# Patient Record
Sex: Female | Born: 1935
Health system: Southern US, Community
[De-identification: ages and names within clinical notes are randomized; demographics above are authoritative.]

## PROBLEM LIST (undated history)

## (undated) DIAGNOSIS — I10 Essential (primary) hypertension: Secondary | ICD-10-CM

## (undated) DIAGNOSIS — E785 Hyperlipidemia, unspecified: Secondary | ICD-10-CM

---

## 2001-04-17 ENCOUNTER — Encounter: Payer: Self-pay | Admitting: Orthopaedic Surgery

## 2001-04-17 ENCOUNTER — Encounter: Payer: Self-pay | Admitting: Emergency Medicine

## 2001-04-17 ENCOUNTER — Inpatient Hospital Stay (HOSPITAL_COMMUNITY): Admission: EM | Admit: 2001-04-17 | Discharge: 2001-04-21 | Payer: Self-pay | Admitting: Emergency Medicine

## 2001-04-18 ENCOUNTER — Encounter: Payer: Self-pay | Admitting: Orthopaedic Surgery

## 2001-06-07 ENCOUNTER — Encounter: Admission: RE | Admit: 2001-06-07 | Discharge: 2001-07-07 | Payer: Self-pay | Admitting: Orthopaedic Surgery

## 2002-04-29 ENCOUNTER — Ambulatory Visit (HOSPITAL_COMMUNITY): Admission: RE | Admit: 2002-04-29 | Discharge: 2002-04-29 | Payer: Self-pay | Admitting: Family Medicine

## 2005-06-11 ENCOUNTER — Other Ambulatory Visit: Admission: RE | Admit: 2005-06-11 | Discharge: 2005-06-11 | Payer: Self-pay | Admitting: Family Medicine

## 2013-06-16 ENCOUNTER — Other Ambulatory Visit (HOSPITAL_COMMUNITY): Payer: Self-pay

## 2013-06-17 ENCOUNTER — Encounter (HOSPITAL_COMMUNITY): Payer: Self-pay

## 2013-06-30 ENCOUNTER — Encounter (HOSPITAL_COMMUNITY)
Admission: RE | Admit: 2013-06-30 | Discharge: 2013-06-30 | Disposition: A | Discharge status: Home / Self Care | Payer: Medicare Other | Source: Ambulatory Visit | Attending: Nephrology | Admitting: Nephrology

## 2013-06-30 DIAGNOSIS — N183 Chronic kidney disease, stage 3 unspecified: Secondary | ICD-10-CM | POA: Insufficient documentation

## 2013-06-30 DIAGNOSIS — D631 Anemia in chronic kidney disease: Secondary | ICD-10-CM | POA: Insufficient documentation

## 2013-06-30 DIAGNOSIS — I129 Hypertensive chronic kidney disease with stage 1 through stage 4 chronic kidney disease, or unspecified chronic kidney disease: Secondary | ICD-10-CM | POA: Insufficient documentation

## 2013-06-30 LAB — RENAL FUNCTION PANEL
Albumin: 4.2 g/dL (ref 3.5–5.2)
BUN: 25 mg/dL — ABNORMAL HIGH (ref 6–23)
CO2: 19 mEq/L (ref 19–32)
Calcium: 9.4 mg/dL (ref 8.4–10.5)
Chloride: 108 mEq/L (ref 96–112)
Creatinine, Ser: 2.02 mg/dL — ABNORMAL HIGH (ref 0.50–1.10)
GFR calc Af Amer: 26 mL/min — ABNORMAL LOW (ref >90)
GFR calc non Af Amer: 23 mL/min — ABNORMAL LOW (ref >90)
Glucose, Bld: 97 mg/dL (ref 70–99)
Phosphorus: 3.8 mg/dL (ref 2.3–4.6)
Potassium: 4.8 mEq/L (ref 3.5–5.1)
Sodium: 140 mEq/L (ref 135–145)

## 2013-06-30 LAB — POCT HEMOGLOBIN-HEMACUE: Hemoglobin: 8.6 g/dL — ABNORMAL LOW (ref 12.0–15.0)

## 2013-06-30 MED ORDER — EPOETIN ALFA 10000 UNIT/ML IJ SOLN: 10000.0000 [IU] | INTRAMUSCULAR | Status: DC

## 2013-06-30 MED ORDER — EPOETIN ALFA 20000 UNIT/ML IJ SOLN
INTRAMUSCULAR | Status: AC
  Administered 2013-06-30: 20000 [IU]
  Filled 2013-06-30: qty 1

## 2013-07-13 ENCOUNTER — Other Ambulatory Visit (HOSPITAL_COMMUNITY): Payer: Self-pay | Admitting: *Deleted

## 2013-07-14 ENCOUNTER — Encounter (HOSPITAL_COMMUNITY)
Admission: RE | Admit: 2013-07-14 | Discharge: 2013-07-14 | Disposition: A | Discharge status: Home / Self Care | Payer: Medicare Other | Source: Ambulatory Visit | Attending: Nephrology | Admitting: Nephrology

## 2013-07-14 LAB — POCT HEMOGLOBIN-HEMACUE: Hemoglobin: 8.5 g/dL — ABNORMAL LOW (ref 12.0–15.0)

## 2013-07-14 MED ORDER — EPOETIN ALFA 20000 UNIT/ML IJ SOLN
INTRAMUSCULAR | Status: AC
  Filled 2013-07-14: qty 1

## 2013-07-14 MED ORDER — EPOETIN ALFA 20000 UNIT/ML IJ SOLN
20000.0000 [IU] | INTRAMUSCULAR | Status: DC
  Administered 2013-07-14: 20000 [IU] via SUBCUTANEOUS

## 2013-07-28 ENCOUNTER — Encounter (HOSPITAL_COMMUNITY)
Admission: RE | Admit: 2013-07-28 | Discharge: 2013-07-28 | Disposition: A | Discharge status: Home / Self Care | Payer: Medicare Other | Source: Ambulatory Visit | Attending: Nephrology | Admitting: Nephrology

## 2013-07-28 DIAGNOSIS — I129 Hypertensive chronic kidney disease with stage 1 through stage 4 chronic kidney disease, or unspecified chronic kidney disease: Secondary | ICD-10-CM | POA: Insufficient documentation

## 2013-07-28 DIAGNOSIS — D631 Anemia in chronic kidney disease: Secondary | ICD-10-CM | POA: Insufficient documentation

## 2013-07-28 DIAGNOSIS — N183 Chronic kidney disease, stage 3 unspecified: Secondary | ICD-10-CM | POA: Insufficient documentation

## 2013-07-28 LAB — IRON AND TIBC
Iron: 126 ug/dL (ref 42–135)
Saturation Ratios: 32 % (ref 20–55)
TIBC: 397 ug/dL (ref 250–470)
UIBC: 271 ug/dL (ref 125–400)

## 2013-07-28 LAB — POCT HEMOGLOBIN-HEMACUE: Hemoglobin: 9.7 g/dL — ABNORMAL LOW (ref 12.0–15.0)

## 2013-07-28 LAB — FERRITIN: Ferritin: 190 ng/mL (ref 10–291)

## 2013-07-28 MED ORDER — EPOETIN ALFA 20000 UNIT/ML IJ SOLN: 20000.0000 [IU] | INTRAMUSCULAR | Status: DC

## 2013-07-28 MED ORDER — EPOETIN ALFA 20000 UNIT/ML IJ SOLN
INTRAMUSCULAR | Status: AC
  Administered 2013-07-28: 20000 [IU]
  Filled 2013-07-28: qty 1

## 2013-08-12 ENCOUNTER — Encounter (HOSPITAL_COMMUNITY)
Admission: RE | Admit: 2013-08-12 | Discharge: 2013-08-12 | Disposition: A | Discharge status: Home / Self Care | Payer: Medicare Other | Source: Ambulatory Visit | Attending: Nephrology | Admitting: Nephrology

## 2013-08-12 LAB — POCT HEMOGLOBIN-HEMACUE: Hemoglobin: 9.8 g/dL — ABNORMAL LOW (ref 12.0–15.0)

## 2013-08-12 MED ORDER — EPOETIN ALFA 20000 UNIT/ML IJ SOLN
INTRAMUSCULAR | Status: AC
  Filled 2013-08-12: qty 1

## 2013-08-12 MED ORDER — EPOETIN ALFA 20000 UNIT/ML IJ SOLN
20000.0000 [IU] | INTRAMUSCULAR | Status: DC
  Administered 2013-08-12: 20000 [IU] via SUBCUTANEOUS

## 2013-08-26 ENCOUNTER — Encounter (HOSPITAL_COMMUNITY)
Admission: RE | Admit: 2013-08-26 | Discharge: 2013-08-26 | Disposition: A | Discharge status: Home / Self Care | Payer: Medicare Other | Source: Ambulatory Visit | Attending: Nephrology | Admitting: Nephrology

## 2013-08-26 DIAGNOSIS — N183 Chronic kidney disease, stage 3 unspecified: Secondary | ICD-10-CM | POA: Insufficient documentation

## 2013-08-26 DIAGNOSIS — I129 Hypertensive chronic kidney disease with stage 1 through stage 4 chronic kidney disease, or unspecified chronic kidney disease: Secondary | ICD-10-CM | POA: Insufficient documentation

## 2013-08-26 DIAGNOSIS — D631 Anemia in chronic kidney disease: Secondary | ICD-10-CM | POA: Insufficient documentation

## 2013-08-26 LAB — RENAL FUNCTION PANEL
Albumin: 4.3 g/dL (ref 3.5–5.2)
BUN: 25 mg/dL — ABNORMAL HIGH (ref 6–23)
CO2: 20 mEq/L (ref 19–32)
Calcium: 9 mg/dL (ref 8.4–10.5)
Chloride: 105 mEq/L (ref 96–112)
Creatinine, Ser: 2.19 mg/dL — ABNORMAL HIGH (ref 0.50–1.10)
GFR calc Af Amer: 24 mL/min — ABNORMAL LOW (ref >90)
GFR calc non Af Amer: 20 mL/min — ABNORMAL LOW (ref >90)
Glucose, Bld: 88 mg/dL (ref 70–99)
Phosphorus: 4.4 mg/dL (ref 2.3–4.6)
Potassium: 5.6 mEq/L — ABNORMAL HIGH (ref 3.5–5.1)
Sodium: 136 mEq/L (ref 135–145)

## 2013-08-26 LAB — POCT HEMOGLOBIN-HEMACUE: Hemoglobin: 10.1 g/dL — ABNORMAL LOW (ref 12.0–15.0)

## 2013-08-26 MED ORDER — EPOETIN ALFA 20000 UNIT/ML IJ SOLN
20000.0000 [IU] | INTRAMUSCULAR | Status: DC
  Administered 2013-08-26: 20000 [IU] via SUBCUTANEOUS

## 2013-08-26 MED ORDER — EPOETIN ALFA 20000 UNIT/ML IJ SOLN
INTRAMUSCULAR | Status: AC
  Filled 2013-08-26: qty 1

## 2013-08-29 LAB — PTH, INTACT AND CALCIUM
Calcium, Total (PTH): 8.8 mg/dL (ref 8.4–10.5)
PTH: 66 pg/mL (ref 14.0–72.0)

## 2013-09-09 ENCOUNTER — Encounter (HOSPITAL_COMMUNITY)
Admission: RE | Admit: 2013-09-09 | Discharge: 2013-09-09 | Disposition: A | Discharge status: Home / Self Care | Payer: Medicare Other | Source: Ambulatory Visit | Attending: Nephrology | Admitting: Nephrology

## 2013-09-09 LAB — IRON AND TIBC
Iron: 156 ug/dL — ABNORMAL HIGH (ref 42–135)
Saturation Ratios: 42 % (ref 20–55)
TIBC: 370 ug/dL (ref 250–470)
UIBC: 214 ug/dL (ref 125–400)

## 2013-09-09 LAB — FERRITIN: Ferritin: 222 ng/mL (ref 10–291)

## 2013-09-09 LAB — POCT HEMOGLOBIN-HEMACUE: Hemoglobin: 10.2 g/dL — ABNORMAL LOW (ref 12.0–15.0)

## 2013-09-09 MED ORDER — EPOETIN ALFA 20000 UNIT/ML IJ SOLN
INTRAMUSCULAR | Status: AC
  Administered 2013-09-09: 20000 [IU] via SUBCUTANEOUS
  Filled 2013-09-09: qty 1

## 2013-09-09 MED ORDER — EPOETIN ALFA 20000 UNIT/ML IJ SOLN
20000.0000 [IU] | INTRAMUSCULAR | Status: DC
  Administered 2013-09-09: 20000 [IU] via SUBCUTANEOUS

## 2013-09-23 ENCOUNTER — Encounter (HOSPITAL_COMMUNITY)
Admission: RE | Admit: 2013-09-23 | Discharge: 2013-09-23 | Disposition: A | Discharge status: Home / Self Care | Payer: Medicare Other | Source: Ambulatory Visit | Attending: Nephrology | Admitting: Nephrology

## 2013-09-23 DIAGNOSIS — I129 Hypertensive chronic kidney disease with stage 1 through stage 4 chronic kidney disease, or unspecified chronic kidney disease: Secondary | ICD-10-CM | POA: Insufficient documentation

## 2013-09-23 DIAGNOSIS — N183 Chronic kidney disease, stage 3 unspecified: Secondary | ICD-10-CM | POA: Insufficient documentation

## 2013-09-23 DIAGNOSIS — D631 Anemia in chronic kidney disease: Secondary | ICD-10-CM | POA: Insufficient documentation

## 2013-09-23 LAB — POCT HEMOGLOBIN-HEMACUE: Hemoglobin: 9.8 g/dL — ABNORMAL LOW (ref 12.0–15.0)

## 2013-09-23 MED ORDER — EPOETIN ALFA 20000 UNIT/ML IJ SOLN: 20000.0000 [IU] | INTRAMUSCULAR | Status: DC

## 2013-09-23 MED ORDER — EPOETIN ALFA 20000 UNIT/ML IJ SOLN
INTRAMUSCULAR | Status: AC
  Administered 2013-09-23: 20000 [IU] via SUBCUTANEOUS
  Filled 2013-09-23: qty 1

## 2013-10-06 ENCOUNTER — Other Ambulatory Visit (HOSPITAL_COMMUNITY): Payer: Self-pay | Admitting: *Deleted

## 2013-10-07 ENCOUNTER — Encounter (HOSPITAL_COMMUNITY)
Admission: RE | Admit: 2013-10-07 | Discharge: 2013-10-07 | Disposition: A | Discharge status: Home / Self Care | Payer: Medicare Other | Source: Ambulatory Visit | Attending: Nephrology | Admitting: Nephrology

## 2013-10-07 LAB — IRON AND TIBC
Iron: 141 ug/dL — ABNORMAL HIGH (ref 42–135)
Saturation Ratios: 36 % (ref 20–55)
TIBC: 397 ug/dL (ref 250–470)
UIBC: 256 ug/dL (ref 125–400)

## 2013-10-07 LAB — POCT HEMOGLOBIN-HEMACUE: Hemoglobin: 10.3 g/dL — ABNORMAL LOW (ref 12.0–15.0)

## 2013-10-07 LAB — FERRITIN: Ferritin: 276 ng/mL (ref 10–291)

## 2013-10-07 MED ORDER — EPOETIN ALFA 20000 UNIT/ML IJ SOLN
20000.0000 [IU] | INTRAMUSCULAR | Status: DC
  Administered 2013-10-07: 20000 [IU] via SUBCUTANEOUS

## 2013-10-07 MED ORDER — EPOETIN ALFA 20000 UNIT/ML IJ SOLN
INTRAMUSCULAR | Status: AC
  Filled 2013-10-07: qty 1

## 2013-11-03 ENCOUNTER — Other Ambulatory Visit (HOSPITAL_COMMUNITY): Payer: Self-pay | Admitting: *Deleted

## 2013-11-04 ENCOUNTER — Encounter (HOSPITAL_COMMUNITY)
Admission: RE | Admit: 2013-11-04 | Discharge: 2013-11-04 | Disposition: A | Discharge status: Home / Self Care | Payer: Medicare Other | Source: Ambulatory Visit | Attending: Nephrology | Admitting: Nephrology

## 2013-11-04 DIAGNOSIS — D631 Anemia in chronic kidney disease: Secondary | ICD-10-CM | POA: Insufficient documentation

## 2013-11-04 DIAGNOSIS — N183 Chronic kidney disease, stage 3 unspecified: Secondary | ICD-10-CM | POA: Insufficient documentation

## 2013-11-04 DIAGNOSIS — N039 Chronic nephritic syndrome with unspecified morphologic changes: Principal | ICD-10-CM

## 2013-11-04 DIAGNOSIS — I129 Hypertensive chronic kidney disease with stage 1 through stage 4 chronic kidney disease, or unspecified chronic kidney disease: Secondary | ICD-10-CM | POA: Insufficient documentation

## 2013-11-04 LAB — IRON AND TIBC
Iron: 158 ug/dL — ABNORMAL HIGH (ref 42–135)
Saturation Ratios: 46 % (ref 20–55)
TIBC: 347 ug/dL (ref 250–470)
UIBC: 189 ug/dL (ref 125–400)

## 2013-11-04 LAB — FERRITIN: Ferritin: 745 ng/mL — ABNORMAL HIGH (ref 10–291)

## 2013-11-04 LAB — POCT HEMOGLOBIN-HEMACUE: Hemoglobin: 9.3 g/dL — ABNORMAL LOW (ref 12.0–15.0)

## 2013-11-04 MED ORDER — EPOETIN ALFA 20000 UNIT/ML IJ SOLN
INTRAMUSCULAR | Status: AC
  Administered 2013-11-04: 20000 [IU] via SUBCUTANEOUS
  Filled 2013-11-04: qty 1

## 2013-11-04 MED ORDER — EPOETIN ALFA 20000 UNIT/ML IJ SOLN: 20000.0000 [IU] | INTRAMUSCULAR | Status: DC

## 2013-12-01 ENCOUNTER — Other Ambulatory Visit (HOSPITAL_COMMUNITY): Payer: Self-pay | Admitting: *Deleted

## 2013-12-02 ENCOUNTER — Encounter (HOSPITAL_COMMUNITY)
Admission: RE | Admit: 2013-12-02 | Discharge: 2013-12-02 | Disposition: A | Discharge status: Home / Self Care | Payer: Medicare Other | Source: Ambulatory Visit | Attending: Nephrology | Admitting: Nephrology

## 2013-12-02 DIAGNOSIS — I129 Hypertensive chronic kidney disease with stage 1 through stage 4 chronic kidney disease, or unspecified chronic kidney disease: Secondary | ICD-10-CM | POA: Insufficient documentation

## 2013-12-02 DIAGNOSIS — N183 Chronic kidney disease, stage 3 unspecified: Secondary | ICD-10-CM | POA: Insufficient documentation

## 2013-12-02 DIAGNOSIS — D631 Anemia in chronic kidney disease: Secondary | ICD-10-CM | POA: Insufficient documentation

## 2013-12-02 DIAGNOSIS — N039 Chronic nephritic syndrome with unspecified morphologic changes: Principal | ICD-10-CM

## 2013-12-02 LAB — IRON AND TIBC
Iron: 178 ug/dL — ABNORMAL HIGH (ref 42–135)
Saturation Ratios: 49 % (ref 20–55)
TIBC: 366 ug/dL (ref 250–470)
UIBC: 188 ug/dL (ref 125–400)

## 2013-12-02 LAB — FERRITIN: Ferritin: 761 ng/mL — ABNORMAL HIGH (ref 10–291)

## 2013-12-02 MED ORDER — EPOETIN ALFA 40000 UNIT/ML IJ SOLN
40000.0000 [IU] | INTRAMUSCULAR | Status: DC
  Administered 2013-12-02: 40000 [IU] via SUBCUTANEOUS

## 2013-12-02 MED ORDER — EPOETIN ALFA 40000 UNIT/ML IJ SOLN
INTRAMUSCULAR | Status: AC
  Filled 2013-12-02: qty 1

## 2013-12-05 LAB — POCT HEMOGLOBIN-HEMACUE: Hemoglobin: 9.7 g/dL — ABNORMAL LOW (ref 12.0–15.0)

## 2013-12-30 ENCOUNTER — Encounter (HOSPITAL_COMMUNITY)
Admission: RE | Admit: 2013-12-30 | Discharge: 2013-12-30 | Disposition: A | Discharge status: Home / Self Care | Payer: Medicare Other | Source: Ambulatory Visit | Attending: Nephrology | Admitting: Nephrology

## 2013-12-30 DIAGNOSIS — D631 Anemia in chronic kidney disease: Secondary | ICD-10-CM | POA: Insufficient documentation

## 2013-12-30 DIAGNOSIS — N183 Chronic kidney disease, stage 3 unspecified: Secondary | ICD-10-CM | POA: Insufficient documentation

## 2013-12-30 DIAGNOSIS — I129 Hypertensive chronic kidney disease with stage 1 through stage 4 chronic kidney disease, or unspecified chronic kidney disease: Secondary | ICD-10-CM | POA: Insufficient documentation

## 2013-12-30 DIAGNOSIS — N039 Chronic nephritic syndrome with unspecified morphologic changes: Principal | ICD-10-CM

## 2013-12-30 LAB — POCT HEMOGLOBIN-HEMACUE: Hemoglobin: 9.8 g/dL — ABNORMAL LOW (ref 12.0–15.0)

## 2013-12-30 LAB — IRON AND TIBC
Iron: 187 ug/dL — ABNORMAL HIGH (ref 42–135)
Saturation Ratios: 51 % (ref 20–55)
TIBC: 364 ug/dL (ref 250–470)
UIBC: 177 ug/dL (ref 125–400)

## 2013-12-30 LAB — FERRITIN: Ferritin: 653 ng/mL — ABNORMAL HIGH (ref 10–291)

## 2013-12-30 MED ORDER — EPOETIN ALFA 40000 UNIT/ML IJ SOLN
40000.0000 [IU] | INTRAMUSCULAR | Status: DC
  Administered 2013-12-30: 40000 [IU] via SUBCUTANEOUS

## 2013-12-30 MED ORDER — EPOETIN ALFA 40000 UNIT/ML IJ SOLN
INTRAMUSCULAR | Status: AC
  Filled 2013-12-30: qty 1

## 2014-01-27 ENCOUNTER — Encounter (HOSPITAL_COMMUNITY)
Admission: RE | Admit: 2014-01-27 | Discharge: 2014-01-27 | Disposition: A | Discharge status: Home / Self Care | Payer: Medicare Other | Source: Ambulatory Visit | Attending: Nephrology | Admitting: Nephrology

## 2014-01-27 DIAGNOSIS — D631 Anemia in chronic kidney disease: Secondary | ICD-10-CM | POA: Insufficient documentation

## 2014-01-27 DIAGNOSIS — N039 Chronic nephritic syndrome with unspecified morphologic changes: Principal | ICD-10-CM

## 2014-01-27 DIAGNOSIS — N183 Chronic kidney disease, stage 3 unspecified: Secondary | ICD-10-CM | POA: Insufficient documentation

## 2014-01-27 DIAGNOSIS — I129 Hypertensive chronic kidney disease with stage 1 through stage 4 chronic kidney disease, or unspecified chronic kidney disease: Secondary | ICD-10-CM | POA: Insufficient documentation

## 2014-01-27 LAB — IRON AND TIBC
Iron: 157 ug/dL — ABNORMAL HIGH (ref 42–135)
Saturation Ratios: 49 % (ref 20–55)
TIBC: 320 ug/dL (ref 250–470)
UIBC: 163 ug/dL (ref 125–400)

## 2014-01-27 LAB — FERRITIN: Ferritin: 678 ng/mL — ABNORMAL HIGH (ref 10–291)

## 2014-01-27 LAB — POCT HEMOGLOBIN-HEMACUE: Hemoglobin: 9.1 g/dL — ABNORMAL LOW (ref 12.0–15.0)

## 2014-01-27 MED ORDER — EPOETIN ALFA 40000 UNIT/ML IJ SOLN
INTRAMUSCULAR | Status: AC
  Filled 2014-01-27: qty 1

## 2014-01-27 MED ORDER — EPOETIN ALFA 40000 UNIT/ML IJ SOLN
40000.0000 [IU] | INTRAMUSCULAR | Status: DC
  Administered 2014-01-27: 40000 [IU] via SUBCUTANEOUS

## 2014-02-13 ENCOUNTER — Telehealth: Payer: Self-pay | Admitting: Oncology

## 2014-02-13 NOTE — Telephone Encounter (Signed)
LEFT MESSAGE FOR PATIENT TO RETURN CALL TO SCHEDULE NP APPT.  °

## 2014-02-24 ENCOUNTER — Encounter (HOSPITAL_COMMUNITY)
Admission: RE | Admit: 2014-02-24 | Discharge: 2014-02-24 | Disposition: A | Discharge status: Home / Self Care | Payer: Medicare Other | Source: Ambulatory Visit | Attending: Nephrology | Admitting: Nephrology

## 2014-02-24 DIAGNOSIS — N183 Chronic kidney disease, stage 3 unspecified: Secondary | ICD-10-CM | POA: Insufficient documentation

## 2014-02-24 DIAGNOSIS — N039 Chronic nephritic syndrome with unspecified morphologic changes: Principal | ICD-10-CM

## 2014-02-24 DIAGNOSIS — I129 Hypertensive chronic kidney disease with stage 1 through stage 4 chronic kidney disease, or unspecified chronic kidney disease: Secondary | ICD-10-CM | POA: Insufficient documentation

## 2014-02-24 DIAGNOSIS — D631 Anemia in chronic kidney disease: Secondary | ICD-10-CM | POA: Insufficient documentation

## 2014-02-24 LAB — IRON AND TIBC
Iron: 157 ug/dL — ABNORMAL HIGH (ref 42–135)
Saturation Ratios: 48 % (ref 20–55)
TIBC: 325 ug/dL (ref 250–470)
UIBC: 168 ug/dL (ref 125–400)

## 2014-02-24 LAB — FERRITIN: Ferritin: 545 ng/mL — ABNORMAL HIGH (ref 10–291)

## 2014-02-24 LAB — POCT HEMOGLOBIN-HEMACUE: Hemoglobin: 9.6 g/dL — ABNORMAL LOW (ref 12.0–15.0)

## 2014-02-24 MED ORDER — EPOETIN ALFA 40000 UNIT/ML IJ SOLN
INTRAMUSCULAR | Status: AC
Start: 2014-02-24 — End: 2014-02-24
  Filled 2014-02-24: qty 1

## 2014-02-24 MED ORDER — EPOETIN ALFA 40000 UNIT/ML IJ SOLN
40000.0000 [IU] | INTRAMUSCULAR | Status: DC
  Administered 2014-02-24: 40000 [IU] via SUBCUTANEOUS

## 2014-03-24 ENCOUNTER — Encounter (HOSPITAL_COMMUNITY)
Admission: RE | Admit: 2014-03-24 | Discharge: 2014-03-24 | Disposition: A | Discharge status: Home / Self Care | Payer: Medicare Other | Source: Ambulatory Visit | Attending: Nephrology | Admitting: Nephrology

## 2014-03-24 DIAGNOSIS — N183 Chronic kidney disease, stage 3 unspecified: Secondary | ICD-10-CM | POA: Insufficient documentation

## 2014-03-24 DIAGNOSIS — I129 Hypertensive chronic kidney disease with stage 1 through stage 4 chronic kidney disease, or unspecified chronic kidney disease: Secondary | ICD-10-CM | POA: Insufficient documentation

## 2014-03-24 DIAGNOSIS — D631 Anemia in chronic kidney disease: Secondary | ICD-10-CM | POA: Insufficient documentation

## 2014-03-24 DIAGNOSIS — N039 Chronic nephritic syndrome with unspecified morphologic changes: Principal | ICD-10-CM

## 2014-03-24 LAB — POCT HEMOGLOBIN-HEMACUE: Hemoglobin: 10.1 g/dL — ABNORMAL LOW (ref 12.0–15.0)

## 2014-03-24 LAB — IRON AND TIBC
Iron: 158 ug/dL — ABNORMAL HIGH (ref 42–135)
Saturation Ratios: 46 % (ref 20–55)
TIBC: 347 ug/dL (ref 250–470)
UIBC: 189 ug/dL (ref 125–400)

## 2014-03-24 LAB — FERRITIN: Ferritin: 815 ng/mL — ABNORMAL HIGH (ref 10–291)

## 2014-03-24 MED ORDER — EPOETIN ALFA 40000 UNIT/ML IJ SOLN: 40000.0000 [IU] | INTRAMUSCULAR | Status: DC

## 2014-03-24 MED ORDER — EPOETIN ALFA 40000 UNIT/ML IJ SOLN
INTRAMUSCULAR | Status: AC
  Administered 2014-03-24: 40000 [IU] via SUBCUTANEOUS
  Filled 2014-03-24: qty 1

## 2014-04-24 ENCOUNTER — Encounter (HOSPITAL_COMMUNITY): Payer: Medicare Other

## 2014-04-28 ENCOUNTER — Encounter (HOSPITAL_COMMUNITY)
Admission: RE | Admit: 2014-04-28 | Discharge: 2014-04-28 | Disposition: A | Discharge status: Home / Self Care | Payer: Medicare Other | Source: Ambulatory Visit | Attending: Nephrology | Admitting: Nephrology

## 2014-04-28 DIAGNOSIS — N039 Chronic nephritic syndrome with unspecified morphologic changes: Principal | ICD-10-CM

## 2014-04-28 DIAGNOSIS — N183 Chronic kidney disease, stage 3 unspecified: Secondary | ICD-10-CM | POA: Diagnosis not present

## 2014-04-28 DIAGNOSIS — D631 Anemia in chronic kidney disease: Secondary | ICD-10-CM | POA: Diagnosis not present

## 2014-04-28 DIAGNOSIS — I129 Hypertensive chronic kidney disease with stage 1 through stage 4 chronic kidney disease, or unspecified chronic kidney disease: Secondary | ICD-10-CM | POA: Diagnosis not present

## 2014-04-28 LAB — FERRITIN: Ferritin: 775 ng/mL — ABNORMAL HIGH (ref 10–291)

## 2014-04-28 LAB — IRON AND TIBC
Iron: 173 ug/dL — ABNORMAL HIGH (ref 42–135)
Saturation Ratios: 50 % (ref 20–55)
TIBC: 345 ug/dL (ref 250–470)
UIBC: 172 ug/dL (ref 125–400)

## 2014-04-28 LAB — POCT HEMOGLOBIN-HEMACUE: Hemoglobin: 9.6 g/dL — ABNORMAL LOW (ref 12.0–15.0)

## 2014-04-28 MED ORDER — EPOETIN ALFA 40000 UNIT/ML IJ SOLN
40000.0000 [IU] | INTRAMUSCULAR | Status: DC
  Administered 2014-04-28: 40000 [IU] via SUBCUTANEOUS

## 2014-04-28 MED ORDER — EPOETIN ALFA 40000 UNIT/ML IJ SOLN
INTRAMUSCULAR | Status: AC
  Administered 2014-04-28: 40000 [IU] via SUBCUTANEOUS
  Filled 2014-04-28: qty 1

## 2014-05-26 ENCOUNTER — Encounter (HOSPITAL_COMMUNITY)
Admission: RE | Admit: 2014-05-26 | Discharge: 2014-05-26 | Disposition: A | Discharge status: Home / Self Care | Payer: Medicare Other | Source: Ambulatory Visit | Attending: Nephrology | Admitting: Nephrology

## 2014-05-26 DIAGNOSIS — N183 Chronic kidney disease, stage 3 unspecified: Secondary | ICD-10-CM | POA: Insufficient documentation

## 2014-05-26 DIAGNOSIS — I129 Hypertensive chronic kidney disease with stage 1 through stage 4 chronic kidney disease, or unspecified chronic kidney disease: Secondary | ICD-10-CM | POA: Diagnosis not present

## 2014-05-26 DIAGNOSIS — D631 Anemia in chronic kidney disease: Secondary | ICD-10-CM | POA: Diagnosis not present

## 2014-05-26 DIAGNOSIS — N039 Chronic nephritic syndrome with unspecified morphologic changes: Secondary | ICD-10-CM | POA: Diagnosis not present

## 2014-05-26 LAB — FERRITIN: Ferritin: 651 ng/mL — ABNORMAL HIGH (ref 10–291)

## 2014-05-26 LAB — IRON AND TIBC
Iron: 169 ug/dL — ABNORMAL HIGH (ref 42–135)
Saturation Ratios: 51 % (ref 20–55)
TIBC: 333 ug/dL (ref 250–470)
UIBC: 164 ug/dL (ref 125–400)

## 2014-05-26 LAB — POCT HEMOGLOBIN-HEMACUE: Hemoglobin: 10 g/dL — ABNORMAL LOW (ref 12.0–15.0)

## 2014-05-26 MED ORDER — EPOETIN ALFA 40000 UNIT/ML IJ SOLN
INTRAMUSCULAR | Status: AC
Start: 2014-05-26 — End: 2014-05-26
  Filled 2014-05-26: qty 1

## 2014-05-26 MED ORDER — EPOETIN ALFA 40000 UNIT/ML IJ SOLN
40000.0000 [IU] | INTRAMUSCULAR | Status: DC
  Administered 2014-05-26: 40000 [IU] via SUBCUTANEOUS

## 2014-06-23 ENCOUNTER — Encounter (HOSPITAL_COMMUNITY)
Admission: RE | Admit: 2014-06-23 | Discharge: 2014-06-23 | Disposition: A | Discharge status: Home / Self Care | Payer: Medicare Other | Source: Ambulatory Visit | Attending: Nephrology | Admitting: Nephrology

## 2014-06-23 DIAGNOSIS — I129 Hypertensive chronic kidney disease with stage 1 through stage 4 chronic kidney disease, or unspecified chronic kidney disease: Secondary | ICD-10-CM | POA: Insufficient documentation

## 2014-06-23 DIAGNOSIS — N183 Chronic kidney disease, stage 3 unspecified: Secondary | ICD-10-CM | POA: Diagnosis not present

## 2014-06-23 DIAGNOSIS — D631 Anemia in chronic kidney disease: Secondary | ICD-10-CM | POA: Diagnosis not present

## 2014-06-23 DIAGNOSIS — N039 Chronic nephritic syndrome with unspecified morphologic changes: Principal | ICD-10-CM

## 2014-06-23 LAB — FERRITIN: Ferritin: 593 ng/mL — ABNORMAL HIGH (ref 10–291)

## 2014-06-23 LAB — IRON AND TIBC
Iron: 142 ug/dL — ABNORMAL HIGH (ref 42–135)
Saturation Ratios: 43 % (ref 20–55)
TIBC: 330 ug/dL (ref 250–470)
UIBC: 188 ug/dL (ref 125–400)

## 2014-06-23 LAB — POCT HEMOGLOBIN-HEMACUE: Hemoglobin: 9.8 g/dL — ABNORMAL LOW (ref 12.0–15.0)

## 2014-06-23 MED ORDER — EPOETIN ALFA 40000 UNIT/ML IJ SOLN: 40000.0000 [IU] | INTRAMUSCULAR | Status: DC

## 2014-06-23 MED ORDER — EPOETIN ALFA 40000 UNIT/ML IJ SOLN
INTRAMUSCULAR | Status: AC
  Administered 2014-06-23: 40000 [IU] via SUBCUTANEOUS
  Filled 2014-06-23: qty 1

## 2014-07-21 ENCOUNTER — Encounter (HOSPITAL_COMMUNITY)
Admission: RE | Admit: 2014-07-21 | Discharge: 2014-07-21 | Disposition: A | Discharge status: Home / Self Care | Payer: Medicare Other | Source: Ambulatory Visit | Attending: Nephrology | Admitting: Nephrology

## 2014-07-21 DIAGNOSIS — D631 Anemia in chronic kidney disease: Secondary | ICD-10-CM | POA: Insufficient documentation

## 2014-07-21 DIAGNOSIS — N183 Chronic kidney disease, stage 3 (moderate): Secondary | ICD-10-CM | POA: Insufficient documentation

## 2014-07-21 LAB — POCT HEMOGLOBIN-HEMACUE: Hemoglobin: 10.5 g/dL — ABNORMAL LOW (ref 12.0–15.0)

## 2014-07-21 LAB — IRON AND TIBC
Iron: 160 ug/dL — ABNORMAL HIGH (ref 42–135)
Saturation Ratios: 49 % (ref 20–55)
TIBC: 329 ug/dL (ref 250–470)
UIBC: 169 ug/dL (ref 125–400)

## 2014-07-21 LAB — FERRITIN: Ferritin: 637 ng/mL — ABNORMAL HIGH (ref 10–291)

## 2014-07-21 MED ORDER — EPOETIN ALFA 40000 UNIT/ML IJ SOLN
40000.0000 [IU] | INTRAMUSCULAR | Status: DC
  Administered 2014-07-21: 40000 [IU] via SUBCUTANEOUS

## 2014-07-21 MED ORDER — EPOETIN ALFA 40000 UNIT/ML IJ SOLN
INTRAMUSCULAR | Status: AC
  Administered 2014-07-21: 40000 [IU] via SUBCUTANEOUS
  Filled 2014-07-21: qty 1

## 2014-08-24 ENCOUNTER — Other Ambulatory Visit (HOSPITAL_COMMUNITY): Payer: Self-pay | Admitting: *Deleted

## 2014-08-25 ENCOUNTER — Encounter (HOSPITAL_COMMUNITY)
Admission: RE | Admit: 2014-08-25 | Discharge: 2014-08-25 | Disposition: A | Discharge status: Home / Self Care | Payer: Medicare Other | Source: Ambulatory Visit | Attending: Nephrology | Admitting: Nephrology

## 2014-08-25 DIAGNOSIS — D631 Anemia in chronic kidney disease: Secondary | ICD-10-CM | POA: Insufficient documentation

## 2014-08-25 DIAGNOSIS — N183 Chronic kidney disease, stage 3 (moderate): Secondary | ICD-10-CM | POA: Diagnosis not present

## 2014-08-25 LAB — IRON AND TIBC
Iron: 193 ug/dL — ABNORMAL HIGH (ref 42–135)
Saturation Ratios: 57 % — ABNORMAL HIGH (ref 20–55)
TIBC: 338 ug/dL (ref 250–470)
UIBC: 145 ug/dL (ref 125–400)

## 2014-08-25 LAB — FERRITIN: Ferritin: 782 ng/mL — ABNORMAL HIGH (ref 10–291)

## 2014-08-25 LAB — POCT HEMOGLOBIN-HEMACUE: Hemoglobin: 10 g/dL — ABNORMAL LOW (ref 12.0–15.0)

## 2014-08-25 MED ORDER — EPOETIN ALFA 40000 UNIT/ML IJ SOLN
INTRAMUSCULAR | Status: AC
  Filled 2014-08-25: qty 1

## 2014-08-25 MED ORDER — EPOETIN ALFA 40000 UNIT/ML IJ SOLN
40000.0000 [IU] | INTRAMUSCULAR | Status: DC
  Administered 2014-08-25: 40000 [IU] via SUBCUTANEOUS

## 2014-09-22 ENCOUNTER — Encounter (HOSPITAL_COMMUNITY)
Admission: RE | Admit: 2014-09-22 | Discharge: 2014-09-22 | Disposition: A | Discharge status: Home / Self Care | Payer: Medicare Other | Source: Ambulatory Visit | Attending: Nephrology | Admitting: Nephrology

## 2014-09-22 DIAGNOSIS — N183 Chronic kidney disease, stage 3 (moderate): Secondary | ICD-10-CM | POA: Insufficient documentation

## 2014-09-22 DIAGNOSIS — D631 Anemia in chronic kidney disease: Secondary | ICD-10-CM | POA: Insufficient documentation

## 2014-09-22 LAB — IRON AND TIBC
Iron: 183 ug/dL — ABNORMAL HIGH (ref 42–135)
Saturation Ratios: 52 % (ref 20–55)
TIBC: 353 ug/dL (ref 250–470)
UIBC: 170 ug/dL (ref 125–400)

## 2014-09-22 LAB — POCT HEMOGLOBIN-HEMACUE: Hemoglobin: 10.4 g/dL — ABNORMAL LOW (ref 12.0–15.0)

## 2014-09-22 LAB — FERRITIN: Ferritin: 828 ng/mL — ABNORMAL HIGH (ref 10–291)

## 2014-09-22 MED ORDER — EPOETIN ALFA 40000 UNIT/ML IJ SOLN
40000.0000 [IU] | INTRAMUSCULAR | Status: DC
  Administered 2014-09-22: 40000 [IU] via SUBCUTANEOUS

## 2014-10-27 ENCOUNTER — Encounter (HOSPITAL_COMMUNITY)
Admission: RE | Admit: 2014-10-27 | Discharge: 2014-10-27 | Disposition: A | Discharge status: Home / Self Care | Payer: Medicare Other | Source: Ambulatory Visit | Attending: Nephrology | Admitting: Nephrology

## 2014-10-27 DIAGNOSIS — N183 Chronic kidney disease, stage 3 (moderate): Secondary | ICD-10-CM | POA: Diagnosis not present

## 2014-10-27 DIAGNOSIS — D631 Anemia in chronic kidney disease: Secondary | ICD-10-CM | POA: Diagnosis present

## 2014-10-27 LAB — IRON AND TIBC
Iron: 152 ug/dL — ABNORMAL HIGH (ref 42–145)
Saturation Ratios: 49 % (ref 20–55)
TIBC: 312 ug/dL (ref 250–470)
UIBC: 160 ug/dL (ref 125–400)

## 2014-10-27 LAB — POCT HEMOGLOBIN-HEMACUE: Hemoglobin: 9.7 g/dL — ABNORMAL LOW (ref 12.0–15.0)

## 2014-10-27 MED ORDER — EPOETIN ALFA 40000 UNIT/ML IJ SOLN
INTRAMUSCULAR | Status: AC
  Filled 2014-10-27: qty 1

## 2014-10-27 MED ORDER — EPOETIN ALFA 40000 UNIT/ML IJ SOLN
40000.0000 [IU] | INTRAMUSCULAR | Status: DC
  Administered 2014-10-27: 40000 [IU] via SUBCUTANEOUS

## 2014-10-28 LAB — FERRITIN: Ferritin: 871 ng/mL — ABNORMAL HIGH (ref 10–291)

## 2014-11-23 ENCOUNTER — Other Ambulatory Visit (HOSPITAL_COMMUNITY): Payer: Self-pay | Admitting: *Deleted

## 2014-11-24 ENCOUNTER — Encounter (HOSPITAL_COMMUNITY)
Admission: RE | Admit: 2014-11-24 | Discharge: 2014-11-24 | Disposition: A | Discharge status: Home / Self Care | Payer: Medicare Other | Source: Ambulatory Visit | Attending: Nephrology | Admitting: Nephrology

## 2014-11-24 DIAGNOSIS — D631 Anemia in chronic kidney disease: Secondary | ICD-10-CM | POA: Insufficient documentation

## 2014-11-24 DIAGNOSIS — N183 Chronic kidney disease, stage 3 (moderate): Secondary | ICD-10-CM | POA: Insufficient documentation

## 2014-11-24 LAB — IRON AND TIBC
Iron: 150 ug/dL — ABNORMAL HIGH (ref 42–145)
Saturation Ratios: 47 % (ref 20–55)
TIBC: 318 ug/dL (ref 250–470)
UIBC: 168 ug/dL (ref 125–400)

## 2014-11-24 LAB — POCT HEMOGLOBIN-HEMACUE: Hemoglobin: 10.1 g/dL — ABNORMAL LOW (ref 12.0–15.0)

## 2014-11-24 MED ORDER — EPOETIN ALFA 40000 UNIT/ML IJ SOLN
INTRAMUSCULAR | Status: AC
  Administered 2014-11-24: 40000 [IU] via SUBCUTANEOUS
  Filled 2014-11-24: qty 1

## 2014-11-24 MED ORDER — EPOETIN ALFA 40000 UNIT/ML IJ SOLN: 40000.0000 [IU] | INTRAMUSCULAR | Status: DC

## 2014-11-25 LAB — FERRITIN: Ferritin: 818 ng/mL — ABNORMAL HIGH (ref 10–291)

## 2014-12-22 ENCOUNTER — Encounter (HOSPITAL_COMMUNITY)
Admission: RE | Admit: 2014-12-22 | Discharge: 2014-12-22 | Disposition: A | Discharge status: Home / Self Care | Payer: Medicare Other | Source: Ambulatory Visit | Attending: Nephrology | Admitting: Nephrology

## 2014-12-22 DIAGNOSIS — N183 Chronic kidney disease, stage 3 (moderate): Secondary | ICD-10-CM | POA: Insufficient documentation

## 2014-12-22 DIAGNOSIS — D631 Anemia in chronic kidney disease: Secondary | ICD-10-CM | POA: Insufficient documentation

## 2014-12-22 LAB — POCT HEMOGLOBIN-HEMACUE: Hemoglobin: 10.2 g/dL — ABNORMAL LOW (ref 12.0–15.0)

## 2014-12-22 LAB — FERRITIN: Ferritin: 674 ng/mL — ABNORMAL HIGH (ref 10–291)

## 2014-12-22 LAB — IRON AND TIBC
Iron: 119 ug/dL (ref 42–145)
Saturation Ratios: 39 % (ref 20–55)
TIBC: 309 ug/dL (ref 250–470)
UIBC: 190 ug/dL (ref 125–400)

## 2014-12-22 MED ORDER — EPOETIN ALFA 40000 UNIT/ML IJ SOLN
INTRAMUSCULAR | Status: DC
Start: 2014-12-22 — End: 2014-12-23
  Filled 2014-12-22: qty 1

## 2014-12-22 MED ORDER — EPOETIN ALFA 40000 UNIT/ML IJ SOLN
40000.0000 [IU] | INTRAMUSCULAR | Status: DC
  Administered 2014-12-22: 40000 [IU] via SUBCUTANEOUS

## 2015-01-26 ENCOUNTER — Encounter (HOSPITAL_COMMUNITY)
Admission: RE | Admit: 2015-01-26 | Discharge: 2015-01-26 | Disposition: A | Discharge status: Home / Self Care | Payer: Medicare Other | Source: Ambulatory Visit | Attending: Nephrology | Admitting: Nephrology

## 2015-01-26 DIAGNOSIS — D631 Anemia in chronic kidney disease: Secondary | ICD-10-CM | POA: Insufficient documentation

## 2015-01-26 DIAGNOSIS — N183 Chronic kidney disease, stage 3 (moderate): Secondary | ICD-10-CM | POA: Diagnosis not present

## 2015-01-26 LAB — FERRITIN: Ferritin: 817 ng/mL — ABNORMAL HIGH (ref 10–291)

## 2015-01-26 LAB — IRON AND TIBC
Iron: 153 ug/dL — ABNORMAL HIGH (ref 42–145)
Saturation Ratios: 47 % (ref 20–55)
TIBC: 325 ug/dL (ref 250–470)
UIBC: 172 ug/dL (ref 125–400)

## 2015-01-26 LAB — POCT HEMOGLOBIN-HEMACUE: Hemoglobin: 8.9 g/dL — ABNORMAL LOW (ref 12.0–15.0)

## 2015-01-26 MED ORDER — EPOETIN ALFA 40000 UNIT/ML IJ SOLN
40000.0000 [IU] | INTRAMUSCULAR | Status: DC
  Administered 2015-01-26: 40000 [IU] via SUBCUTANEOUS

## 2015-01-26 MED ORDER — EPOETIN ALFA 40000 UNIT/ML IJ SOLN
INTRAMUSCULAR | Status: AC
  Administered 2015-01-26: 40000 [IU] via SUBCUTANEOUS
  Filled 2015-01-26: qty 1

## 2015-02-22 ENCOUNTER — Other Ambulatory Visit (HOSPITAL_COMMUNITY): Payer: Self-pay | Admitting: *Deleted

## 2015-02-23 ENCOUNTER — Encounter (HOSPITAL_COMMUNITY)
Admission: RE | Admit: 2015-02-23 | Discharge: 2015-02-23 | Disposition: A | Discharge status: Home / Self Care | Payer: Medicare Other | Source: Ambulatory Visit | Attending: Nephrology | Admitting: Nephrology

## 2015-02-23 DIAGNOSIS — D631 Anemia in chronic kidney disease: Secondary | ICD-10-CM | POA: Diagnosis present

## 2015-02-23 DIAGNOSIS — N183 Chronic kidney disease, stage 3 (moderate): Secondary | ICD-10-CM | POA: Insufficient documentation

## 2015-02-23 LAB — IRON AND TIBC
Iron: 122 ug/dL (ref 28–170)
Saturation Ratios: 36 % — ABNORMAL HIGH (ref 10.4–31.8)
TIBC: 339 ug/dL (ref 250–450)
UIBC: 217 ug/dL

## 2015-02-23 LAB — POCT HEMOGLOBIN-HEMACUE: Hemoglobin: 9 g/dL — ABNORMAL LOW (ref 12.0–15.0)

## 2015-02-23 LAB — FERRITIN: Ferritin: 661 ng/mL — ABNORMAL HIGH (ref 11–307)

## 2015-02-23 MED ORDER — EPOETIN ALFA 40000 UNIT/ML IJ SOLN
INTRAMUSCULAR | Status: AC
  Filled 2015-02-23: qty 1

## 2015-02-23 MED ORDER — EPOETIN ALFA 40000 UNIT/ML IJ SOLN
40000.0000 [IU] | INTRAMUSCULAR | Status: DC
  Administered 2015-02-23: 40000 [IU] via SUBCUTANEOUS

## 2015-03-23 ENCOUNTER — Encounter (HOSPITAL_COMMUNITY)
Admission: RE | Admit: 2015-03-23 | Discharge: 2015-03-23 | Disposition: A | Discharge status: Home / Self Care | Payer: Medicare Other | Source: Ambulatory Visit | Attending: Nephrology | Admitting: Nephrology

## 2015-03-23 DIAGNOSIS — N183 Chronic kidney disease, stage 3 (moderate): Secondary | ICD-10-CM | POA: Insufficient documentation

## 2015-03-23 DIAGNOSIS — D631 Anemia in chronic kidney disease: Secondary | ICD-10-CM | POA: Diagnosis present

## 2015-03-23 LAB — IRON AND TIBC
Iron: 163 ug/dL (ref 28–170)
Saturation Ratios: 48 % — ABNORMAL HIGH (ref 10.4–31.8)
TIBC: 337 ug/dL (ref 250–450)
UIBC: 174 ug/dL

## 2015-03-23 LAB — FERRITIN: Ferritin: 736 ng/mL — ABNORMAL HIGH (ref 11–307)

## 2015-03-23 LAB — POCT HEMOGLOBIN-HEMACUE: Hemoglobin: 9.6 g/dL — ABNORMAL LOW (ref 12.0–15.0)

## 2015-03-23 MED ORDER — EPOETIN ALFA 40000 UNIT/ML IJ SOLN
INTRAMUSCULAR | Status: AC
  Filled 2015-03-23: qty 1

## 2015-03-23 MED ORDER — EPOETIN ALFA 40000 UNIT/ML IJ SOLN
40000.0000 [IU] | INTRAMUSCULAR | Status: DC
  Administered 2015-03-23: 40000 [IU] via SUBCUTANEOUS

## 2015-04-20 ENCOUNTER — Encounter (HOSPITAL_COMMUNITY)
Admission: RE | Admit: 2015-04-20 | Discharge: 2015-04-20 | Disposition: A | Discharge status: Home / Self Care | Payer: Medicare Other | Source: Ambulatory Visit | Attending: Nephrology | Admitting: Nephrology

## 2015-04-20 DIAGNOSIS — D631 Anemia in chronic kidney disease: Secondary | ICD-10-CM | POA: Insufficient documentation

## 2015-04-20 DIAGNOSIS — N183 Chronic kidney disease, stage 3 (moderate): Secondary | ICD-10-CM | POA: Diagnosis not present

## 2015-04-20 LAB — IRON AND TIBC
Iron: 159 ug/dL (ref 28–170)
Saturation Ratios: 43 % — ABNORMAL HIGH (ref 10.4–31.8)
TIBC: 371 ug/dL (ref 250–450)
UIBC: 212 ug/dL

## 2015-04-20 LAB — POCT HEMOGLOBIN-HEMACUE: Hemoglobin: 9.7 g/dL — ABNORMAL LOW (ref 12.0–15.0)

## 2015-04-20 LAB — FERRITIN: Ferritin: 885 ng/mL — ABNORMAL HIGH (ref 11–307)

## 2015-04-20 MED ORDER — EPOETIN ALFA 40000 UNIT/ML IJ SOLN
INTRAMUSCULAR | Status: AC
  Filled 2015-04-20: qty 1

## 2015-04-20 MED ORDER — EPOETIN ALFA 40000 UNIT/ML IJ SOLN
40000.0000 [IU] | INTRAMUSCULAR | Status: DC
  Administered 2015-04-20: 40000 [IU] via SUBCUTANEOUS

## 2015-05-25 ENCOUNTER — Encounter (HOSPITAL_COMMUNITY)
Admission: RE | Admit: 2015-05-25 | Discharge: 2015-05-25 | Disposition: A | Discharge status: Home / Self Care | Payer: Medicare Other | Source: Ambulatory Visit | Attending: Nephrology | Admitting: Nephrology

## 2015-05-25 DIAGNOSIS — N183 Chronic kidney disease, stage 3 (moderate): Secondary | ICD-10-CM | POA: Diagnosis not present

## 2015-05-25 DIAGNOSIS — D631 Anemia in chronic kidney disease: Secondary | ICD-10-CM | POA: Insufficient documentation

## 2015-05-25 LAB — POCT HEMOGLOBIN-HEMACUE: Hemoglobin: 9.2 g/dL — ABNORMAL LOW (ref 12.0–15.0)

## 2015-05-25 LAB — IRON AND TIBC
Iron: 183 ug/dL — ABNORMAL HIGH (ref 28–170)
Saturation Ratios: 48 % — ABNORMAL HIGH (ref 10.4–31.8)
TIBC: 381 ug/dL (ref 250–450)
UIBC: 198 ug/dL

## 2015-05-25 LAB — FERRITIN: Ferritin: 1387 ng/mL — ABNORMAL HIGH (ref 11–307)

## 2015-05-25 MED ORDER — EPOETIN ALFA 40000 UNIT/ML IJ SOLN
40000.0000 [IU] | INTRAMUSCULAR | Status: DC
  Administered 2015-05-25: 40000 [IU] via SUBCUTANEOUS

## 2015-05-25 MED ORDER — EPOETIN ALFA 40000 UNIT/ML IJ SOLN
INTRAMUSCULAR | Status: AC
  Filled 2015-05-25: qty 1

## 2015-06-22 ENCOUNTER — Encounter (HOSPITAL_COMMUNITY)
Admission: RE | Admit: 2015-06-22 | Discharge: 2015-06-22 | Disposition: A | Discharge status: Home / Self Care | Payer: Medicare Other | Source: Ambulatory Visit | Attending: Nephrology | Admitting: Nephrology

## 2015-06-22 DIAGNOSIS — D631 Anemia in chronic kidney disease: Secondary | ICD-10-CM | POA: Diagnosis not present

## 2015-06-22 DIAGNOSIS — N183 Chronic kidney disease, stage 3 (moderate): Secondary | ICD-10-CM | POA: Diagnosis not present

## 2015-06-22 LAB — IRON AND TIBC
Iron: 165 ug/dL (ref 28–170)
Saturation Ratios: 47 % — ABNORMAL HIGH (ref 10.4–31.8)
TIBC: 354 ug/dL (ref 250–450)
UIBC: 189 ug/dL

## 2015-06-22 LAB — FERRITIN: Ferritin: 968 ng/mL — ABNORMAL HIGH (ref 11–307)

## 2015-06-22 LAB — POCT HEMOGLOBIN-HEMACUE: Hemoglobin: 8.7 g/dL — ABNORMAL LOW (ref 12.0–15.0)

## 2015-06-22 MED ORDER — EPOETIN ALFA 40000 UNIT/ML IJ SOLN
INTRAMUSCULAR | Status: AC
  Administered 2015-06-22: 40000 [IU] via SUBCUTANEOUS
  Filled 2015-06-22: qty 1

## 2015-06-22 MED ORDER — EPOETIN ALFA 40000 UNIT/ML IJ SOLN
40000.0000 [IU] | INTRAMUSCULAR | Status: DC
  Administered 2015-06-22: 40000 [IU] via SUBCUTANEOUS

## 2015-07-27 ENCOUNTER — Encounter (HOSPITAL_COMMUNITY)
Admission: RE | Admit: 2015-07-27 | Discharge: 2015-07-27 | Disposition: A | Discharge status: Home / Self Care | Payer: Medicare Other | Source: Ambulatory Visit | Attending: Nephrology | Admitting: Nephrology

## 2015-07-27 DIAGNOSIS — D631 Anemia in chronic kidney disease: Secondary | ICD-10-CM | POA: Insufficient documentation

## 2015-07-27 DIAGNOSIS — N183 Chronic kidney disease, stage 3 (moderate): Secondary | ICD-10-CM | POA: Insufficient documentation

## 2015-07-27 LAB — POCT HEMOGLOBIN-HEMACUE: Hemoglobin: 8.7 g/dL — ABNORMAL LOW (ref 12.0–15.0)

## 2015-07-27 LAB — IRON AND TIBC
Iron: 169 ug/dL (ref 28–170)
Saturation Ratios: 47 % — ABNORMAL HIGH (ref 10.4–31.8)
TIBC: 358 ug/dL (ref 250–450)
UIBC: 189 ug/dL

## 2015-07-27 LAB — FERRITIN: Ferritin: 1143 ng/mL — ABNORMAL HIGH (ref 11–307)

## 2015-07-27 MED ORDER — EPOETIN ALFA 40000 UNIT/ML IJ SOLN
INTRAMUSCULAR | Status: AC
  Filled 2015-07-27: qty 1

## 2015-07-27 MED ORDER — EPOETIN ALFA 40000 UNIT/ML IJ SOLN
40000.0000 [IU] | INTRAMUSCULAR | Status: DC
  Administered 2015-07-27: 40000 [IU] via SUBCUTANEOUS

## 2015-08-23 ENCOUNTER — Other Ambulatory Visit (HOSPITAL_COMMUNITY): Payer: Self-pay | Admitting: *Deleted

## 2015-08-24 ENCOUNTER — Encounter (HOSPITAL_COMMUNITY)
Admission: RE | Admit: 2015-08-24 | Discharge: 2015-08-24 | Disposition: A | Discharge status: Home / Self Care | Payer: Medicare Other | Source: Ambulatory Visit | Attending: Nephrology | Admitting: Nephrology

## 2015-08-24 DIAGNOSIS — N183 Chronic kidney disease, stage 3 (moderate): Secondary | ICD-10-CM | POA: Insufficient documentation

## 2015-08-24 DIAGNOSIS — D631 Anemia in chronic kidney disease: Secondary | ICD-10-CM | POA: Diagnosis present

## 2015-08-24 LAB — FERRITIN: Ferritin: 798 ng/mL — ABNORMAL HIGH (ref 11–307)

## 2015-08-24 LAB — POCT HEMOGLOBIN-HEMACUE: Hemoglobin: 8.8 g/dL — ABNORMAL LOW (ref 12.0–15.0)

## 2015-08-24 LAB — IRON AND TIBC
Iron: 160 ug/dL (ref 28–170)
Saturation Ratios: 48 % — ABNORMAL HIGH (ref 10.4–31.8)
TIBC: 332 ug/dL (ref 250–450)
UIBC: 172 ug/dL

## 2015-08-24 MED ORDER — EPOETIN ALFA 40000 UNIT/ML IJ SOLN
40000.0000 [IU] | INTRAMUSCULAR | Status: DC
  Administered 2015-08-24: 40000 [IU] via SUBCUTANEOUS

## 2015-08-24 MED ORDER — EPOETIN ALFA 40000 UNIT/ML IJ SOLN
INTRAMUSCULAR | Status: AC
  Filled 2015-08-24: qty 1

## 2015-09-21 ENCOUNTER — Encounter (HOSPITAL_COMMUNITY)
Admission: RE | Admit: 2015-09-21 | Discharge: 2015-09-21 | Disposition: A | Discharge status: Home / Self Care | Payer: Medicare Other | Source: Ambulatory Visit | Attending: Nephrology | Admitting: Nephrology

## 2015-09-21 DIAGNOSIS — D631 Anemia in chronic kidney disease: Secondary | ICD-10-CM | POA: Insufficient documentation

## 2015-09-21 DIAGNOSIS — N183 Chronic kidney disease, stage 3 (moderate): Secondary | ICD-10-CM | POA: Insufficient documentation

## 2015-09-21 LAB — IRON AND TIBC
Iron: 181 ug/dL — ABNORMAL HIGH (ref 28–170)
Saturation Ratios: 51 % — ABNORMAL HIGH (ref 10.4–31.8)
TIBC: 356 ug/dL (ref 250–450)
UIBC: 175 ug/dL

## 2015-09-21 LAB — FERRITIN: Ferritin: 944 ng/mL — ABNORMAL HIGH (ref 11–307)

## 2015-09-21 LAB — POCT HEMOGLOBIN-HEMACUE: Hemoglobin: 9.5 g/dL — ABNORMAL LOW (ref 12.0–15.0)

## 2015-09-21 MED ORDER — EPOETIN ALFA 40000 UNIT/ML IJ SOLN
INTRAMUSCULAR | Status: AC
  Administered 2015-09-21: 40000 [IU] via SUBCUTANEOUS
  Filled 2015-09-21: qty 1

## 2015-09-21 MED ORDER — EPOETIN ALFA 40000 UNIT/ML IJ SOLN
40000.0000 [IU] | INTRAMUSCULAR | Status: DC
  Administered 2015-09-21: 40000 [IU] via SUBCUTANEOUS

## 2015-10-26 ENCOUNTER — Encounter (HOSPITAL_COMMUNITY)
Admission: RE | Admit: 2015-10-26 | Discharge: 2015-10-26 | Disposition: A | Discharge status: Home / Self Care | Payer: Medicare Other | Source: Ambulatory Visit | Attending: Nephrology | Admitting: Nephrology

## 2015-10-26 DIAGNOSIS — D631 Anemia in chronic kidney disease: Secondary | ICD-10-CM | POA: Insufficient documentation

## 2015-10-26 DIAGNOSIS — N183 Chronic kidney disease, stage 3 (moderate): Secondary | ICD-10-CM | POA: Diagnosis not present

## 2015-10-26 LAB — IRON AND TIBC
Iron: 153 ug/dL (ref 28–170)
Saturation Ratios: 47 % — ABNORMAL HIGH (ref 10.4–31.8)
TIBC: 325 ug/dL (ref 250–450)
UIBC: 172 ug/dL

## 2015-10-26 LAB — FERRITIN: Ferritin: 1017 ng/mL — ABNORMAL HIGH (ref 11–307)

## 2015-10-26 LAB — POCT HEMOGLOBIN-HEMACUE: Hemoglobin: 9.8 g/dL — ABNORMAL LOW (ref 12.0–15.0)

## 2015-10-26 MED ORDER — EPOETIN ALFA 40000 UNIT/ML IJ SOLN
40000.0000 [IU] | INTRAMUSCULAR | Status: DC
  Administered 2015-10-26: 40000 [IU] via SUBCUTANEOUS

## 2015-10-26 MED ORDER — EPOETIN ALFA 40000 UNIT/ML IJ SOLN
INTRAMUSCULAR | Status: AC
  Filled 2015-10-26: qty 1

## 2015-11-22 ENCOUNTER — Other Ambulatory Visit (HOSPITAL_COMMUNITY): Payer: Self-pay | Admitting: *Deleted

## 2015-11-23 ENCOUNTER — Encounter (HOSPITAL_COMMUNITY)
Admission: RE | Admit: 2015-11-23 | Discharge: 2015-11-23 | Disposition: A | Discharge status: Home / Self Care | Payer: Medicare Other | Source: Ambulatory Visit | Attending: Nephrology | Admitting: Nephrology

## 2015-11-23 DIAGNOSIS — D631 Anemia in chronic kidney disease: Secondary | ICD-10-CM | POA: Insufficient documentation

## 2015-11-23 DIAGNOSIS — N183 Chronic kidney disease, stage 3 (moderate): Secondary | ICD-10-CM | POA: Diagnosis not present

## 2015-11-23 LAB — FERRITIN: Ferritin: 923 ng/mL — ABNORMAL HIGH (ref 11–307)

## 2015-11-23 LAB — IRON AND TIBC
Iron: 156 ug/dL (ref 28–170)
Saturation Ratios: 46 % — ABNORMAL HIGH (ref 10.4–31.8)
TIBC: 339 ug/dL (ref 250–450)
UIBC: 183 ug/dL

## 2015-11-23 MED ORDER — EPOETIN ALFA 40000 UNIT/ML IJ SOLN
40000.0000 [IU] | INTRAMUSCULAR | Status: DC
  Administered 2015-11-23: 40000 [IU] via SUBCUTANEOUS

## 2015-11-23 MED ORDER — EPOETIN ALFA 40000 UNIT/ML IJ SOLN
INTRAMUSCULAR | Status: AC
  Filled 2015-11-23: qty 1

## 2015-11-26 LAB — POCT HEMOGLOBIN-HEMACUE: Hemoglobin: 10.1 g/dL — ABNORMAL LOW (ref 12.0–15.0)

## 2015-12-21 ENCOUNTER — Encounter (HOSPITAL_COMMUNITY)
Admission: RE | Admit: 2015-12-21 | Discharge: 2015-12-21 | Disposition: A | Discharge status: Home / Self Care | Payer: Medicare Other | Source: Ambulatory Visit | Attending: Nephrology | Admitting: Nephrology

## 2015-12-21 DIAGNOSIS — N183 Chronic kidney disease, stage 3 (moderate): Secondary | ICD-10-CM | POA: Insufficient documentation

## 2015-12-21 DIAGNOSIS — D631 Anemia in chronic kidney disease: Secondary | ICD-10-CM | POA: Diagnosis not present

## 2015-12-21 LAB — IRON AND TIBC
Iron: 158 ug/dL (ref 28–170)
Saturation Ratios: 48 % — ABNORMAL HIGH (ref 10.4–31.8)
TIBC: 330 ug/dL (ref 250–450)
UIBC: 172 ug/dL

## 2015-12-21 LAB — POCT HEMOGLOBIN-HEMACUE: Hemoglobin: 10.1 g/dL — ABNORMAL LOW (ref 12.0–15.0)

## 2015-12-21 LAB — FERRITIN: Ferritin: 1133 ng/mL — ABNORMAL HIGH (ref 11–307)

## 2015-12-21 MED ORDER — EPOETIN ALFA 40000 UNIT/ML IJ SOLN
INTRAMUSCULAR | Status: AC
  Filled 2015-12-21: qty 1

## 2015-12-21 MED ORDER — EPOETIN ALFA 40000 UNIT/ML IJ SOLN
40000.0000 [IU] | INTRAMUSCULAR | Status: DC
  Administered 2015-12-21: 40000 [IU] via SUBCUTANEOUS

## 2016-01-25 ENCOUNTER — Encounter (HOSPITAL_COMMUNITY)
Admission: RE | Admit: 2016-01-25 | Discharge: 2016-01-25 | Disposition: A | Discharge status: Home / Self Care | Payer: Medicare Other | Source: Ambulatory Visit | Attending: Nephrology | Admitting: Nephrology

## 2016-01-25 DIAGNOSIS — N183 Chronic kidney disease, stage 3 (moderate): Secondary | ICD-10-CM | POA: Diagnosis not present

## 2016-01-25 DIAGNOSIS — D631 Anemia in chronic kidney disease: Secondary | ICD-10-CM | POA: Insufficient documentation

## 2016-01-25 LAB — IRON AND TIBC
Iron: 133 ug/dL (ref 28–170)
Saturation Ratios: 39 % — ABNORMAL HIGH (ref 10.4–31.8)
TIBC: 344 ug/dL (ref 250–450)
UIBC: 211 ug/dL

## 2016-01-25 LAB — FERRITIN: Ferritin: 1326 ng/mL — ABNORMAL HIGH (ref 11–307)

## 2016-01-25 MED ORDER — EPOETIN ALFA 40000 UNIT/ML IJ SOLN
INTRAMUSCULAR | Status: AC
  Filled 2016-01-25: qty 1

## 2016-01-25 MED ORDER — EPOETIN ALFA 40000 UNIT/ML IJ SOLN
40000.0000 [IU] | INTRAMUSCULAR | Status: DC
  Administered 2016-01-25: 40000 [IU] via SUBCUTANEOUS

## 2016-01-28 LAB — POCT HEMOGLOBIN-HEMACUE: Hemoglobin: 10.4 g/dL — ABNORMAL LOW (ref 12.0–15.0)

## 2016-02-21 ENCOUNTER — Other Ambulatory Visit (HOSPITAL_COMMUNITY): Payer: Self-pay | Admitting: *Deleted

## 2016-02-22 ENCOUNTER — Encounter (HOSPITAL_COMMUNITY)
Admission: RE | Admit: 2016-02-22 | Discharge: 2016-02-22 | Disposition: A | Discharge status: Home / Self Care | Payer: Medicare Other | Source: Ambulatory Visit | Attending: Nephrology | Admitting: Nephrology

## 2016-02-22 DIAGNOSIS — D631 Anemia in chronic kidney disease: Secondary | ICD-10-CM | POA: Diagnosis not present

## 2016-02-22 DIAGNOSIS — N183 Chronic kidney disease, stage 3 (moderate): Secondary | ICD-10-CM | POA: Diagnosis not present

## 2016-02-22 LAB — POCT HEMOGLOBIN-HEMACUE: Hemoglobin: 10.2 g/dL — ABNORMAL LOW (ref 12.0–15.0)

## 2016-02-22 LAB — IRON AND TIBC
Iron: 119 ug/dL (ref 28–170)
Saturation Ratios: 38 % — ABNORMAL HIGH (ref 10.4–31.8)
TIBC: 315 ug/dL (ref 250–450)
UIBC: 196 ug/dL

## 2016-02-22 LAB — FERRITIN: Ferritin: 750 ng/mL — ABNORMAL HIGH (ref 11–307)

## 2016-02-22 MED ORDER — EPOETIN ALFA 40000 UNIT/ML IJ SOLN
INTRAMUSCULAR | Status: AC
  Filled 2016-02-22: qty 1

## 2016-02-22 MED ORDER — EPOETIN ALFA 40000 UNIT/ML IJ SOLN
40000.0000 [IU] | INTRAMUSCULAR | Status: DC
  Administered 2016-02-22: 40000 [IU] via SUBCUTANEOUS

## 2016-03-21 ENCOUNTER — Encounter (HOSPITAL_COMMUNITY)
Admission: RE | Admit: 2016-03-21 | Discharge: 2016-03-21 | Disposition: A | Discharge status: Home / Self Care | Payer: Medicare Other | Source: Ambulatory Visit | Attending: Nephrology | Admitting: Nephrology

## 2016-03-21 DIAGNOSIS — N183 Chronic kidney disease, stage 3 (moderate): Secondary | ICD-10-CM | POA: Insufficient documentation

## 2016-03-21 DIAGNOSIS — D631 Anemia in chronic kidney disease: Secondary | ICD-10-CM | POA: Diagnosis present

## 2016-03-21 LAB — IRON AND TIBC
Iron: 144 ug/dL (ref 28–170)
Saturation Ratios: 42 % — ABNORMAL HIGH (ref 10.4–31.8)
TIBC: 344 ug/dL (ref 250–450)
UIBC: 200 ug/dL

## 2016-03-21 LAB — FERRITIN: Ferritin: 968 ng/mL — ABNORMAL HIGH (ref 11–307)

## 2016-03-21 LAB — POCT HEMOGLOBIN-HEMACUE: Hemoglobin: 10.1 g/dL — ABNORMAL LOW (ref 12.0–15.0)

## 2016-03-21 MED ORDER — EPOETIN ALFA 40000 UNIT/ML IJ SOLN
40000.0000 [IU] | INTRAMUSCULAR | Status: DC
  Administered 2016-03-21: 40000 [IU] via SUBCUTANEOUS

## 2016-03-21 MED ORDER — EPOETIN ALFA 40000 UNIT/ML IJ SOLN
INTRAMUSCULAR | Status: AC
  Filled 2016-03-21: qty 1

## 2016-04-25 ENCOUNTER — Encounter (HOSPITAL_COMMUNITY)
Admission: RE | Admit: 2016-04-25 | Discharge: 2016-04-25 | Disposition: A | Discharge status: Home / Self Care | Payer: Medicare Other | Source: Ambulatory Visit | Attending: Nephrology | Admitting: Nephrology

## 2016-04-25 DIAGNOSIS — D631 Anemia in chronic kidney disease: Secondary | ICD-10-CM | POA: Diagnosis present

## 2016-04-25 DIAGNOSIS — N183 Chronic kidney disease, stage 3 (moderate): Secondary | ICD-10-CM | POA: Insufficient documentation

## 2016-04-25 LAB — FERRITIN: Ferritin: 1333 ng/mL — ABNORMAL HIGH (ref 11–307)

## 2016-04-25 LAB — IRON AND TIBC
Iron: 123 ug/dL (ref 28–170)
Saturation Ratios: 35 % — ABNORMAL HIGH (ref 10.4–31.8)
TIBC: 347 ug/dL (ref 250–450)
UIBC: 224 ug/dL

## 2016-04-25 LAB — POCT HEMOGLOBIN-HEMACUE: Hemoglobin: 9.6 g/dL — ABNORMAL LOW (ref 12.0–15.0)

## 2016-04-25 MED ORDER — EPOETIN ALFA 40000 UNIT/ML IJ SOLN
INTRAMUSCULAR | Status: AC
  Filled 2016-04-25: qty 1

## 2016-04-25 MED ORDER — EPOETIN ALFA 40000 UNIT/ML IJ SOLN
40000.0000 [IU] | INTRAMUSCULAR | Status: DC
  Administered 2016-04-25: 40000 [IU] via SUBCUTANEOUS

## 2016-05-15 ENCOUNTER — Other Ambulatory Visit: Payer: Self-pay | Admitting: Nephrology

## 2016-05-15 DIAGNOSIS — D638 Anemia in other chronic diseases classified elsewhere: Secondary | ICD-10-CM | POA: Insufficient documentation

## 2016-05-22 ENCOUNTER — Other Ambulatory Visit (HOSPITAL_COMMUNITY): Payer: Self-pay | Admitting: *Deleted

## 2016-05-23 ENCOUNTER — Encounter (HOSPITAL_COMMUNITY)
Admission: RE | Admit: 2016-05-23 | Discharge: 2016-05-23 | Disposition: A | Discharge status: Home / Self Care | Payer: Medicare Other | Source: Ambulatory Visit | Attending: Nephrology | Admitting: Nephrology

## 2016-05-23 DIAGNOSIS — D638 Anemia in other chronic diseases classified elsewhere: Secondary | ICD-10-CM

## 2016-05-23 DIAGNOSIS — N183 Chronic kidney disease, stage 3 (moderate): Secondary | ICD-10-CM | POA: Diagnosis not present

## 2016-05-23 DIAGNOSIS — D631 Anemia in chronic kidney disease: Secondary | ICD-10-CM | POA: Diagnosis present

## 2016-05-23 LAB — IRON AND TIBC
Iron: 171 ug/dL — ABNORMAL HIGH (ref 28–170)
Saturation Ratios: 49 % — ABNORMAL HIGH (ref 10.4–31.8)
TIBC: 346 ug/dL (ref 250–450)
UIBC: 175 ug/dL

## 2016-05-23 LAB — FERRITIN: Ferritin: 1214 ng/mL — ABNORMAL HIGH (ref 11–307)

## 2016-05-23 LAB — POCT HEMOGLOBIN-HEMACUE: Hemoglobin: 9.8 g/dL — ABNORMAL LOW (ref 12.0–15.0)

## 2016-05-23 MED ORDER — EPOETIN ALFA 40000 UNIT/ML IJ SOLN
INTRAMUSCULAR | Status: AC
  Filled 2016-05-23: qty 1

## 2016-05-23 MED ORDER — EPOETIN ALFA 40000 UNIT/ML IJ SOLN
40000.0000 [IU] | INTRAMUSCULAR | Status: DC
  Administered 2016-05-23: 40000 [IU] via SUBCUTANEOUS

## 2016-06-19 ENCOUNTER — Other Ambulatory Visit (HOSPITAL_COMMUNITY): Payer: Self-pay | Admitting: *Deleted

## 2016-06-20 ENCOUNTER — Encounter (HOSPITAL_COMMUNITY): Payer: Medicare Other

## 2016-06-27 ENCOUNTER — Encounter (HOSPITAL_COMMUNITY)
Admission: RE | Admit: 2016-06-27 | Discharge: 2016-06-27 | Disposition: A | Discharge status: Home / Self Care | Payer: Medicare Other | Source: Ambulatory Visit | Attending: Nephrology | Admitting: Nephrology

## 2016-06-27 DIAGNOSIS — N183 Chronic kidney disease, stage 3 (moderate): Secondary | ICD-10-CM | POA: Insufficient documentation

## 2016-06-27 DIAGNOSIS — D631 Anemia in chronic kidney disease: Secondary | ICD-10-CM | POA: Diagnosis not present

## 2016-06-27 DIAGNOSIS — D638 Anemia in other chronic diseases classified elsewhere: Secondary | ICD-10-CM

## 2016-06-27 LAB — FERRITIN: Ferritin: 1428 ng/mL — ABNORMAL HIGH (ref 11–307)

## 2016-06-27 LAB — IRON AND TIBC
Iron: 138 ug/dL (ref 28–170)
Saturation Ratios: 42 % — ABNORMAL HIGH (ref 10.4–31.8)
TIBC: 330 ug/dL (ref 250–450)
UIBC: 192 ug/dL

## 2016-06-27 LAB — POCT HEMOGLOBIN-HEMACUE: Hemoglobin: 9.3 g/dL — ABNORMAL LOW (ref 12.0–15.0)

## 2016-06-27 MED ORDER — EPOETIN ALFA 40000 UNIT/ML IJ SOLN
INTRAMUSCULAR | Status: AC
  Filled 2016-06-27: qty 1

## 2016-06-27 MED ORDER — EPOETIN ALFA 40000 UNIT/ML IJ SOLN
40000.0000 [IU] | INTRAMUSCULAR | Status: DC
  Administered 2016-06-27: 40000 [IU] via SUBCUTANEOUS

## 2016-07-25 ENCOUNTER — Encounter (HOSPITAL_COMMUNITY)
Admission: RE | Admit: 2016-07-25 | Discharge: 2016-07-25 | Disposition: A | Discharge status: Home / Self Care | Payer: Medicare Other | Source: Ambulatory Visit | Attending: Nephrology | Admitting: Nephrology

## 2016-07-25 DIAGNOSIS — D638 Anemia in other chronic diseases classified elsewhere: Secondary | ICD-10-CM

## 2016-07-25 DIAGNOSIS — D631 Anemia in chronic kidney disease: Secondary | ICD-10-CM | POA: Insufficient documentation

## 2016-07-25 DIAGNOSIS — N183 Chronic kidney disease, stage 3 (moderate): Secondary | ICD-10-CM | POA: Insufficient documentation

## 2016-07-25 LAB — FERRITIN: Ferritin: 1031 ng/mL — ABNORMAL HIGH (ref 11–307)

## 2016-07-25 LAB — IRON AND TIBC
Iron: 153 ug/dL (ref 28–170)
Saturation Ratios: 45 % — ABNORMAL HIGH (ref 10.4–31.8)
TIBC: 343 ug/dL (ref 250–450)
UIBC: 190 ug/dL

## 2016-07-25 LAB — POCT HEMOGLOBIN-HEMACUE: Hemoglobin: 9.6 g/dL — ABNORMAL LOW (ref 12.0–15.0)

## 2016-07-25 MED ORDER — EPOETIN ALFA 40000 UNIT/ML IJ SOLN
40000.0000 [IU] | INTRAMUSCULAR | Status: DC
  Administered 2016-07-25: 40000 [IU] via SUBCUTANEOUS

## 2016-07-25 MED ORDER — EPOETIN ALFA 40000 UNIT/ML IJ SOLN
INTRAMUSCULAR | Status: AC
  Filled 2016-07-25: qty 1

## 2016-08-21 ENCOUNTER — Other Ambulatory Visit (HOSPITAL_COMMUNITY): Payer: Self-pay | Admitting: *Deleted

## 2016-08-22 ENCOUNTER — Encounter (HOSPITAL_COMMUNITY): Payer: Medicare Other

## 2016-09-05 ENCOUNTER — Encounter (HOSPITAL_COMMUNITY)
Admission: RE | Admit: 2016-09-05 | Discharge: 2016-09-05 | Disposition: A | Discharge status: Home / Self Care | Payer: Medicare Other | Source: Ambulatory Visit | Attending: Nephrology | Admitting: Nephrology

## 2016-09-05 DIAGNOSIS — N183 Chronic kidney disease, stage 3 (moderate): Secondary | ICD-10-CM | POA: Insufficient documentation

## 2016-09-05 DIAGNOSIS — D631 Anemia in chronic kidney disease: Secondary | ICD-10-CM | POA: Insufficient documentation

## 2016-09-05 DIAGNOSIS — D638 Anemia in other chronic diseases classified elsewhere: Secondary | ICD-10-CM

## 2016-09-05 LAB — FERRITIN: Ferritin: 1134 ng/mL — ABNORMAL HIGH (ref 11–307)

## 2016-09-05 LAB — POCT HEMOGLOBIN-HEMACUE: Hemoglobin: 9.7 g/dL — ABNORMAL LOW (ref 12.0–15.0)

## 2016-09-05 LAB — IRON AND TIBC
Iron: 177 ug/dL — ABNORMAL HIGH (ref 28–170)
Saturation Ratios: 56 % — ABNORMAL HIGH (ref 10.4–31.8)
TIBC: 315 ug/dL (ref 250–450)
UIBC: 138 ug/dL

## 2016-09-05 MED ORDER — EPOETIN ALFA 40000 UNIT/ML IJ SOLN
40000.0000 [IU] | INTRAMUSCULAR | Status: DC
  Administered 2016-09-05: 40000 [IU] via SUBCUTANEOUS

## 2016-09-05 MED ORDER — EPOETIN ALFA 40000 UNIT/ML IJ SOLN
INTRAMUSCULAR | Status: AC
  Filled 2016-09-05: qty 1

## 2016-10-03 ENCOUNTER — Encounter (HOSPITAL_COMMUNITY)
Admission: RE | Admit: 2016-10-03 | Discharge: 2016-10-03 | Disposition: A | Discharge status: Home / Self Care | Payer: Medicare Other | Source: Ambulatory Visit | Attending: Nephrology | Admitting: Nephrology

## 2016-10-03 DIAGNOSIS — D638 Anemia in other chronic diseases classified elsewhere: Secondary | ICD-10-CM

## 2016-10-03 DIAGNOSIS — N183 Chronic kidney disease, stage 3 (moderate): Secondary | ICD-10-CM | POA: Insufficient documentation

## 2016-10-03 DIAGNOSIS — D631 Anemia in chronic kidney disease: Secondary | ICD-10-CM | POA: Diagnosis present

## 2016-10-03 LAB — IRON AND TIBC
Iron: 128 ug/dL (ref 28–170)
Saturation Ratios: 42 % — ABNORMAL HIGH (ref 10.4–31.8)
TIBC: 307 ug/dL (ref 250–450)
UIBC: 179 ug/dL

## 2016-10-03 LAB — FERRITIN: Ferritin: 1355 ng/mL — ABNORMAL HIGH (ref 11–307)

## 2016-10-03 LAB — POCT HEMOGLOBIN-HEMACUE: Hemoglobin: 10.1 g/dL — ABNORMAL LOW (ref 12.0–15.0)

## 2016-10-03 MED ORDER — EPOETIN ALFA 40000 UNIT/ML IJ SOLN
40000.0000 [IU] | INTRAMUSCULAR | Status: DC
  Administered 2016-10-03: 40000 [IU] via SUBCUTANEOUS

## 2016-10-03 MED ORDER — EPOETIN ALFA 40000 UNIT/ML IJ SOLN
INTRAMUSCULAR | Status: AC
  Filled 2016-10-03: qty 1

## 2016-10-31 ENCOUNTER — Encounter (HOSPITAL_COMMUNITY)
Admission: RE | Admit: 2016-10-31 | Discharge: 2016-10-31 | Disposition: A | Discharge status: Home / Self Care | Payer: Medicare Other | Source: Ambulatory Visit | Attending: Nephrology | Admitting: Nephrology

## 2016-10-31 DIAGNOSIS — D631 Anemia in chronic kidney disease: Secondary | ICD-10-CM | POA: Insufficient documentation

## 2016-10-31 DIAGNOSIS — D638 Anemia in other chronic diseases classified elsewhere: Secondary | ICD-10-CM

## 2016-10-31 DIAGNOSIS — N183 Chronic kidney disease, stage 3 (moderate): Secondary | ICD-10-CM | POA: Diagnosis not present

## 2016-10-31 LAB — IRON AND TIBC
Iron: 147 ug/dL (ref 28–170)
Saturation Ratios: 49 % — ABNORMAL HIGH (ref 10.4–31.8)
TIBC: 300 ug/dL (ref 250–450)
UIBC: 153 ug/dL

## 2016-10-31 LAB — FERRITIN: Ferritin: 1487 ng/mL — ABNORMAL HIGH (ref 11–307)

## 2016-10-31 MED ORDER — EPOETIN ALFA 40000 UNIT/ML IJ SOLN
40000.0000 [IU] | INTRAMUSCULAR | Status: DC
  Administered 2016-10-31: 40000 [IU] via SUBCUTANEOUS

## 2016-10-31 MED ORDER — EPOETIN ALFA 40000 UNIT/ML IJ SOLN
INTRAMUSCULAR | Status: AC
  Administered 2016-10-31: 40000 [IU] via SUBCUTANEOUS
  Filled 2016-10-31: qty 1

## 2016-11-28 ENCOUNTER — Encounter (HOSPITAL_COMMUNITY)
Admission: RE | Admit: 2016-11-28 | Discharge: 2016-11-28 | Disposition: A | Discharge status: Home / Self Care | Payer: Medicare Other | Source: Ambulatory Visit | Attending: Nephrology | Admitting: Nephrology

## 2016-11-28 DIAGNOSIS — N183 Chronic kidney disease, stage 3 (moderate): Secondary | ICD-10-CM | POA: Diagnosis not present

## 2016-11-28 DIAGNOSIS — D638 Anemia in other chronic diseases classified elsewhere: Secondary | ICD-10-CM

## 2016-11-28 DIAGNOSIS — D631 Anemia in chronic kidney disease: Secondary | ICD-10-CM | POA: Diagnosis present

## 2016-11-28 LAB — IRON AND TIBC
Iron: 144 ug/dL (ref 28–170)
Saturation Ratios: 38 % — ABNORMAL HIGH (ref 10.4–31.8)
TIBC: 375 ug/dL (ref 250–450)
UIBC: 231 ug/dL

## 2016-11-28 LAB — POCT HEMOGLOBIN-HEMACUE: Hemoglobin: 10.4 g/dL — ABNORMAL LOW (ref 12.0–15.0)

## 2016-11-28 LAB — FERRITIN: Ferritin: 1488 ng/mL — ABNORMAL HIGH (ref 11–307)

## 2016-11-28 MED ORDER — EPOETIN ALFA 40000 UNIT/ML IJ SOLN
40000.0000 [IU] | INTRAMUSCULAR | Status: DC
  Administered 2016-11-28: 40000 [IU] via SUBCUTANEOUS

## 2016-11-28 MED ORDER — EPOETIN ALFA 40000 UNIT/ML IJ SOLN
INTRAMUSCULAR | Status: AC
  Filled 2016-11-28: qty 1

## 2017-01-02 ENCOUNTER — Encounter (HOSPITAL_COMMUNITY)
Admission: RE | Admit: 2017-01-02 | Discharge: 2017-01-02 | Disposition: A | Discharge status: Home / Self Care | Payer: Medicare Other | Source: Ambulatory Visit | Attending: Nephrology | Admitting: Nephrology

## 2017-01-02 DIAGNOSIS — N183 Chronic kidney disease, stage 3 (moderate): Secondary | ICD-10-CM | POA: Diagnosis not present

## 2017-01-02 DIAGNOSIS — D638 Anemia in other chronic diseases classified elsewhere: Secondary | ICD-10-CM

## 2017-01-02 DIAGNOSIS — D631 Anemia in chronic kidney disease: Secondary | ICD-10-CM | POA: Insufficient documentation

## 2017-01-02 LAB — IRON AND TIBC
Iron: 177 ug/dL — ABNORMAL HIGH (ref 28–170)
Saturation Ratios: 49 % — ABNORMAL HIGH (ref 10.4–31.8)
TIBC: 364 ug/dL (ref 250–450)
UIBC: 187 ug/dL

## 2017-01-02 LAB — FERRITIN: Ferritin: 1515 ng/mL — ABNORMAL HIGH (ref 11–307)

## 2017-01-02 LAB — POCT HEMOGLOBIN-HEMACUE: Hemoglobin: 9.9 g/dL — ABNORMAL LOW (ref 12.0–15.0)

## 2017-01-02 MED ORDER — EPOETIN ALFA 40000 UNIT/ML IJ SOLN
40000.0000 [IU] | INTRAMUSCULAR | Status: DC
  Administered 2017-01-02: 40000 [IU] via SUBCUTANEOUS

## 2017-01-02 MED ORDER — EPOETIN ALFA 40000 UNIT/ML IJ SOLN
INTRAMUSCULAR | Status: AC
  Filled 2017-01-02: qty 1

## 2017-01-30 ENCOUNTER — Encounter (HOSPITAL_COMMUNITY)
Admission: RE | Admit: 2017-01-30 | Discharge: 2017-01-30 | Disposition: A | Discharge status: Home / Self Care | Payer: Medicare Other | Source: Ambulatory Visit | Attending: Nephrology | Admitting: Nephrology

## 2017-01-30 DIAGNOSIS — N183 Chronic kidney disease, stage 3 (moderate): Secondary | ICD-10-CM | POA: Insufficient documentation

## 2017-01-30 DIAGNOSIS — D631 Anemia in chronic kidney disease: Secondary | ICD-10-CM | POA: Diagnosis present

## 2017-01-30 DIAGNOSIS — D638 Anemia in other chronic diseases classified elsewhere: Secondary | ICD-10-CM

## 2017-01-30 LAB — IRON AND TIBC
Iron: 135 ug/dL (ref 28–170)
Saturation Ratios: 41 % — ABNORMAL HIGH (ref 10.4–31.8)
TIBC: 326 ug/dL (ref 250–450)
UIBC: 191 ug/dL

## 2017-01-30 LAB — POCT HEMOGLOBIN-HEMACUE: Hemoglobin: 9 g/dL — ABNORMAL LOW (ref 12.0–15.0)

## 2017-01-30 LAB — FERRITIN: Ferritin: 1439 ng/mL — ABNORMAL HIGH (ref 11–307)

## 2017-01-30 MED ORDER — EPOETIN ALFA 40000 UNIT/ML IJ SOLN
INTRAMUSCULAR | Status: AC
Start: 2017-01-30 — End: 2017-01-30
  Administered 2017-01-30: 40000 [IU] via SUBCUTANEOUS
  Filled 2017-01-30: qty 1

## 2017-01-30 MED ORDER — EPOETIN ALFA 40000 UNIT/ML IJ SOLN
40000.0000 [IU] | INTRAMUSCULAR | Status: DC
  Administered 2017-01-30: 40000 [IU] via SUBCUTANEOUS

## 2017-02-27 ENCOUNTER — Encounter (HOSPITAL_COMMUNITY)
Admission: RE | Admit: 2017-02-27 | Discharge: 2017-02-27 | Disposition: A | Discharge status: Home / Self Care | Payer: Medicare Other | Source: Ambulatory Visit | Attending: Nephrology | Admitting: Nephrology

## 2017-02-27 DIAGNOSIS — N183 Chronic kidney disease, stage 3 (moderate): Secondary | ICD-10-CM | POA: Insufficient documentation

## 2017-02-27 DIAGNOSIS — D631 Anemia in chronic kidney disease: Secondary | ICD-10-CM | POA: Insufficient documentation

## 2017-02-27 DIAGNOSIS — D638 Anemia in other chronic diseases classified elsewhere: Secondary | ICD-10-CM

## 2017-02-27 LAB — FERRITIN: Ferritin: 1428 ng/mL — ABNORMAL HIGH (ref 11–307)

## 2017-02-27 LAB — IRON AND TIBC
Iron: 113 ug/dL (ref 28–170)
Saturation Ratios: 34 % — ABNORMAL HIGH (ref 10.4–31.8)
TIBC: 335 ug/dL (ref 250–450)
UIBC: 222 ug/dL

## 2017-02-27 LAB — POCT HEMOGLOBIN-HEMACUE: Hemoglobin: 9.5 g/dL — ABNORMAL LOW (ref 12.0–15.0)

## 2017-02-27 MED ORDER — EPOETIN ALFA 40000 UNIT/ML IJ SOLN
40000.0000 [IU] | INTRAMUSCULAR | Status: DC
Start: 2017-02-27 — End: 2017-02-28
  Administered 2017-02-27: 40000 [IU] via SUBCUTANEOUS

## 2017-02-27 MED ORDER — EPOETIN ALFA 40000 UNIT/ML IJ SOLN
INTRAMUSCULAR | Status: AC
  Filled 2017-02-27: qty 1

## 2017-03-27 ENCOUNTER — Encounter (HOSPITAL_COMMUNITY)
Admission: RE | Admit: 2017-03-27 | Discharge: 2017-03-27 | Disposition: A | Discharge status: Home / Self Care | Payer: Medicare Other | Source: Ambulatory Visit | Attending: Nephrology | Admitting: Nephrology

## 2017-03-27 DIAGNOSIS — D638 Anemia in other chronic diseases classified elsewhere: Secondary | ICD-10-CM

## 2017-03-27 DIAGNOSIS — N183 Chronic kidney disease, stage 3 (moderate): Secondary | ICD-10-CM | POA: Insufficient documentation

## 2017-03-27 DIAGNOSIS — D631 Anemia in chronic kidney disease: Secondary | ICD-10-CM | POA: Diagnosis present

## 2017-03-27 LAB — FERRITIN: Ferritin: 1149 ng/mL — ABNORMAL HIGH (ref 11–307)

## 2017-03-27 LAB — IRON AND TIBC
Iron: 133 ug/dL (ref 28–170)
Saturation Ratios: 43 % — ABNORMAL HIGH (ref 10.4–31.8)
TIBC: 308 ug/dL (ref 250–450)
UIBC: 175 ug/dL

## 2017-03-27 LAB — POCT HEMOGLOBIN-HEMACUE: Hemoglobin: 9.7 g/dL — ABNORMAL LOW (ref 12.0–15.0)

## 2017-03-27 MED ORDER — EPOETIN ALFA 40000 UNIT/ML IJ SOLN
INTRAMUSCULAR | Status: AC
  Filled 2017-03-27: qty 1

## 2017-03-27 MED ORDER — EPOETIN ALFA 40000 UNIT/ML IJ SOLN
40000.0000 [IU] | INTRAMUSCULAR | Status: DC
  Administered 2017-03-27: 40000 [IU] via SUBCUTANEOUS

## 2017-04-24 ENCOUNTER — Encounter (HOSPITAL_COMMUNITY)
Admission: RE | Admit: 2017-04-24 | Discharge: 2017-04-24 | Disposition: A | Discharge status: Home / Self Care | Payer: Medicare Other | Source: Ambulatory Visit | Attending: Nephrology | Admitting: Nephrology

## 2017-04-24 DIAGNOSIS — D631 Anemia in chronic kidney disease: Secondary | ICD-10-CM | POA: Diagnosis present

## 2017-04-24 DIAGNOSIS — N183 Chronic kidney disease, stage 3 (moderate): Secondary | ICD-10-CM | POA: Diagnosis not present

## 2017-04-24 DIAGNOSIS — D638 Anemia in other chronic diseases classified elsewhere: Secondary | ICD-10-CM

## 2017-04-24 LAB — IRON AND TIBC
Iron: 146 ug/dL (ref 28–170)
Saturation Ratios: 43 % — ABNORMAL HIGH (ref 10.4–31.8)
TIBC: 336 ug/dL (ref 250–450)
UIBC: 190 ug/dL

## 2017-04-24 LAB — FERRITIN: Ferritin: 1476 ng/mL — ABNORMAL HIGH (ref 11–307)

## 2017-04-24 LAB — POCT HEMOGLOBIN-HEMACUE: Hemoglobin: 10.2 g/dL — ABNORMAL LOW (ref 12.0–15.0)

## 2017-04-24 MED ORDER — EPOETIN ALFA 40000 UNIT/ML IJ SOLN
INTRAMUSCULAR | Status: AC
  Filled 2017-04-24: qty 1

## 2017-04-24 MED ORDER — EPOETIN ALFA 40000 UNIT/ML IJ SOLN
40000.0000 [IU] | INTRAMUSCULAR | Status: DC
  Administered 2017-04-24: 40000 [IU] via SUBCUTANEOUS

## 2017-05-22 ENCOUNTER — Encounter (HOSPITAL_COMMUNITY)
Admission: RE | Admit: 2017-05-22 | Discharge: 2017-05-22 | Disposition: A | Discharge status: Home / Self Care | Payer: Medicare Other | Source: Ambulatory Visit | Attending: Nephrology | Admitting: Nephrology

## 2017-05-22 DIAGNOSIS — D631 Anemia in chronic kidney disease: Secondary | ICD-10-CM | POA: Diagnosis present

## 2017-05-22 DIAGNOSIS — D638 Anemia in other chronic diseases classified elsewhere: Secondary | ICD-10-CM

## 2017-05-22 DIAGNOSIS — N183 Chronic kidney disease, stage 3 (moderate): Secondary | ICD-10-CM | POA: Insufficient documentation

## 2017-05-22 LAB — IRON AND TIBC
Iron: 155 ug/dL (ref 28–170)
Saturation Ratios: 47 % — ABNORMAL HIGH (ref 10.4–31.8)
TIBC: 329 ug/dL (ref 250–450)
UIBC: 174 ug/dL

## 2017-05-22 LAB — FERRITIN: Ferritin: 1415 ng/mL — ABNORMAL HIGH (ref 11–307)

## 2017-05-22 LAB — POCT HEMOGLOBIN-HEMACUE: Hemoglobin: 10.2 g/dL — ABNORMAL LOW (ref 12.0–15.0)

## 2017-05-22 MED ORDER — EPOETIN ALFA 40000 UNIT/ML IJ SOLN
INTRAMUSCULAR | Status: AC
  Filled 2017-05-22: qty 1

## 2017-05-22 MED ORDER — EPOETIN ALFA 40000 UNIT/ML IJ SOLN
40000.0000 [IU] | INTRAMUSCULAR | Status: DC
  Administered 2017-05-22: 40000 [IU] via SUBCUTANEOUS

## 2017-06-26 ENCOUNTER — Encounter (HOSPITAL_COMMUNITY)
Admission: RE | Admit: 2017-06-26 | Discharge: 2017-06-26 | Disposition: A | Discharge status: Home / Self Care | Payer: Medicare Other | Source: Ambulatory Visit | Attending: Nephrology | Admitting: Nephrology

## 2017-06-26 DIAGNOSIS — D638 Anemia in other chronic diseases classified elsewhere: Secondary | ICD-10-CM

## 2017-06-26 DIAGNOSIS — D631 Anemia in chronic kidney disease: Secondary | ICD-10-CM | POA: Insufficient documentation

## 2017-06-26 DIAGNOSIS — N183 Chronic kidney disease, stage 3 (moderate): Secondary | ICD-10-CM | POA: Insufficient documentation

## 2017-06-26 LAB — IRON AND TIBC
Iron: 143 ug/dL (ref 28–170)
Saturation Ratios: 43 % — ABNORMAL HIGH (ref 10.4–31.8)
TIBC: 330 ug/dL (ref 250–450)
UIBC: 187 ug/dL

## 2017-06-26 LAB — POCT HEMOGLOBIN-HEMACUE: Hemoglobin: 9.8 g/dL — ABNORMAL LOW (ref 12.0–15.0)

## 2017-06-26 LAB — FERRITIN: Ferritin: 1548 ng/mL — ABNORMAL HIGH (ref 11–307)

## 2017-06-26 MED ORDER — EPOETIN ALFA 40000 UNIT/ML IJ SOLN
INTRAMUSCULAR | Status: AC
  Filled 2017-06-26: qty 1

## 2017-06-26 MED ORDER — EPOETIN ALFA 40000 UNIT/ML IJ SOLN
40000.0000 [IU] | INTRAMUSCULAR | Status: DC
  Administered 2017-06-26: 40000 [IU] via SUBCUTANEOUS

## 2017-07-24 ENCOUNTER — Encounter (HOSPITAL_COMMUNITY)
Admission: RE | Admit: 2017-07-24 | Discharge: 2017-07-24 | Disposition: A | Discharge status: Home / Self Care | Payer: Medicare Other | Source: Ambulatory Visit | Attending: Nephrology | Admitting: Nephrology

## 2017-07-24 DIAGNOSIS — D638 Anemia in other chronic diseases classified elsewhere: Secondary | ICD-10-CM

## 2017-07-24 DIAGNOSIS — D631 Anemia in chronic kidney disease: Secondary | ICD-10-CM | POA: Insufficient documentation

## 2017-07-24 DIAGNOSIS — N183 Chronic kidney disease, stage 3 (moderate): Secondary | ICD-10-CM | POA: Diagnosis not present

## 2017-07-24 LAB — IRON AND TIBC
Iron: 136 ug/dL (ref 28–170)
Saturation Ratios: 42 % — ABNORMAL HIGH (ref 10.4–31.8)
TIBC: 322 ug/dL (ref 250–450)
UIBC: 186 ug/dL

## 2017-07-24 LAB — FERRITIN: Ferritin: 1410 ng/mL — ABNORMAL HIGH (ref 11–307)

## 2017-07-24 MED ORDER — EPOETIN ALFA 40000 UNIT/ML IJ SOLN
40000.0000 [IU] | INTRAMUSCULAR | Status: DC
  Administered 2017-07-24: 40000 [IU] via SUBCUTANEOUS

## 2017-07-24 MED ORDER — EPOETIN ALFA 40000 UNIT/ML IJ SOLN
INTRAMUSCULAR | Status: AC
  Administered 2017-07-24: 40000 [IU] via SUBCUTANEOUS
  Filled 2017-07-24: qty 1

## 2017-07-28 LAB — POCT HEMOGLOBIN-HEMACUE: Hemoglobin: 9.8 g/dL — ABNORMAL LOW (ref 12.0–15.0)

## 2017-08-20 ENCOUNTER — Other Ambulatory Visit (HOSPITAL_COMMUNITY): Payer: Self-pay | Admitting: *Deleted

## 2017-08-21 ENCOUNTER — Encounter (HOSPITAL_COMMUNITY)
Admission: RE | Admit: 2017-08-21 | Discharge: 2017-08-21 | Disposition: A | Discharge status: Home / Self Care | Payer: Medicare Other | Source: Ambulatory Visit | Attending: Nephrology | Admitting: Nephrology

## 2017-08-21 DIAGNOSIS — D631 Anemia in chronic kidney disease: Secondary | ICD-10-CM | POA: Insufficient documentation

## 2017-08-21 DIAGNOSIS — N183 Chronic kidney disease, stage 3 (moderate): Secondary | ICD-10-CM | POA: Diagnosis not present

## 2017-08-21 DIAGNOSIS — D638 Anemia in other chronic diseases classified elsewhere: Secondary | ICD-10-CM

## 2017-08-21 LAB — IRON AND TIBC
Iron: 163 ug/dL (ref 28–170)
Saturation Ratios: 53 % — ABNORMAL HIGH (ref 10.4–31.8)
TIBC: 307 ug/dL (ref 250–450)
UIBC: 144 ug/dL

## 2017-08-21 LAB — FERRITIN: Ferritin: 1111 ng/mL — ABNORMAL HIGH (ref 11–307)

## 2017-08-21 LAB — POCT HEMOGLOBIN-HEMACUE: Hemoglobin: 9.7 g/dL — ABNORMAL LOW (ref 12.0–15.0)

## 2017-08-21 MED ORDER — EPOETIN ALFA 40000 UNIT/ML IJ SOLN
INTRAMUSCULAR | Status: AC
  Administered 2017-08-21: 40000 [IU] via SUBCUTANEOUS
  Filled 2017-08-21: qty 1

## 2017-08-21 MED ORDER — EPOETIN ALFA 40000 UNIT/ML IJ SOLN
40000.0000 [IU] | INTRAMUSCULAR | Status: DC
  Administered 2017-08-21: 40000 [IU] via SUBCUTANEOUS

## 2017-09-25 ENCOUNTER — Encounter (HOSPITAL_COMMUNITY)
Admission: RE | Admit: 2017-09-25 | Discharge: 2017-09-25 | Disposition: A | Discharge status: Home / Self Care | Payer: Medicare Other | Source: Ambulatory Visit | Attending: Nephrology | Admitting: Nephrology

## 2017-09-25 DIAGNOSIS — D631 Anemia in chronic kidney disease: Secondary | ICD-10-CM | POA: Insufficient documentation

## 2017-09-25 DIAGNOSIS — N183 Chronic kidney disease, stage 3 (moderate): Secondary | ICD-10-CM | POA: Diagnosis not present

## 2017-09-25 DIAGNOSIS — D638 Anemia in other chronic diseases classified elsewhere: Secondary | ICD-10-CM

## 2017-09-25 LAB — IRON AND TIBC
Iron: 132 ug/dL (ref 28–170)
Saturation Ratios: 39 % — ABNORMAL HIGH (ref 10.4–31.8)
TIBC: 339 ug/dL (ref 250–450)
UIBC: 207 ug/dL

## 2017-09-25 LAB — FERRITIN: Ferritin: 1599 ng/mL — ABNORMAL HIGH (ref 11–307)

## 2017-09-25 MED ORDER — EPOETIN ALFA 40000 UNIT/ML IJ SOLN
INTRAMUSCULAR | Status: AC
  Administered 2017-10-01: 40000 [IU] via SUBCUTANEOUS
  Filled 2017-09-25: qty 1

## 2017-09-29 LAB — POCT HEMOGLOBIN-HEMACUE: Hemoglobin: 10.3 g/dL — ABNORMAL LOW (ref 12.0–15.0)

## 2017-10-23 ENCOUNTER — Encounter (HOSPITAL_COMMUNITY): Payer: Medicare Other

## 2017-10-27 ENCOUNTER — Encounter (HOSPITAL_COMMUNITY)
Admission: RE | Admit: 2017-10-27 | Discharge: 2017-10-27 | Disposition: A | Discharge status: Home / Self Care | Payer: Medicare Other | Source: Ambulatory Visit | Attending: Nephrology | Admitting: Nephrology

## 2017-10-27 VITALS — BP 128/63 | HR 92 | Temp 98.2°F | Resp 20

## 2017-10-27 DIAGNOSIS — N183 Chronic kidney disease, stage 3 (moderate): Secondary | ICD-10-CM | POA: Diagnosis not present

## 2017-10-27 DIAGNOSIS — D631 Anemia in chronic kidney disease: Secondary | ICD-10-CM | POA: Insufficient documentation

## 2017-10-27 DIAGNOSIS — D638 Anemia in other chronic diseases classified elsewhere: Secondary | ICD-10-CM

## 2017-10-27 LAB — POCT HEMOGLOBIN-HEMACUE: Hemoglobin: 9.4 g/dL — ABNORMAL LOW (ref 12.0–15.0)

## 2017-10-27 LAB — IRON AND TIBC
Iron: 140 ug/dL (ref 28–170)
Saturation Ratios: 44 % — ABNORMAL HIGH (ref 10.4–31.8)
TIBC: 319 ug/dL (ref 250–450)
UIBC: 179 ug/dL

## 2017-10-27 LAB — FERRITIN: Ferritin: 1504 ng/mL — ABNORMAL HIGH (ref 11–307)

## 2017-10-27 MED ORDER — EPOETIN ALFA 40000 UNIT/ML IJ SOLN
40000.0000 [IU] | INTRAMUSCULAR | Status: DC
  Administered 2017-10-27: 40000 [IU] via SUBCUTANEOUS

## 2017-10-27 MED ORDER — EPOETIN ALFA 40000 UNIT/ML IJ SOLN
INTRAMUSCULAR | Status: AC
  Filled 2017-10-27: qty 1

## 2017-11-24 ENCOUNTER — Encounter (HOSPITAL_COMMUNITY)
Admission: RE | Admit: 2017-11-24 | Discharge: 2017-11-24 | Disposition: A | Discharge status: Home / Self Care | Payer: Medicare Other | Source: Ambulatory Visit | Attending: Nephrology | Admitting: Nephrology

## 2017-11-24 VITALS — BP 112/53 | HR 81 | Temp 98.4°F | Resp 20

## 2017-11-24 DIAGNOSIS — D638 Anemia in other chronic diseases classified elsewhere: Secondary | ICD-10-CM

## 2017-11-24 DIAGNOSIS — D631 Anemia in chronic kidney disease: Secondary | ICD-10-CM | POA: Insufficient documentation

## 2017-11-24 DIAGNOSIS — N183 Chronic kidney disease, stage 3 (moderate): Secondary | ICD-10-CM | POA: Diagnosis not present

## 2017-11-24 LAB — IRON AND TIBC
Iron: 134 ug/dL (ref 28–170)
Saturation Ratios: 40 % — ABNORMAL HIGH (ref 10.4–31.8)
TIBC: 336 ug/dL (ref 250–450)
UIBC: 202 ug/dL

## 2017-11-24 LAB — FERRITIN: Ferritin: 2012 ng/mL — ABNORMAL HIGH (ref 11–307)

## 2017-11-24 LAB — POCT HEMOGLOBIN-HEMACUE: Hemoglobin: 9.6 g/dL — ABNORMAL LOW (ref 12.0–15.0)

## 2017-11-24 MED ORDER — EPOETIN ALFA 40000 UNIT/ML IJ SOLN
INTRAMUSCULAR | Status: AC
  Administered 2017-11-24: 40000 [IU] via SUBCUTANEOUS
  Filled 2017-11-24: qty 1

## 2017-11-24 MED ORDER — EPOETIN ALFA 40000 UNIT/ML IJ SOLN
40000.0000 [IU] | INTRAMUSCULAR | Status: DC
Start: 2017-11-24 — End: 2017-11-25
  Administered 2017-11-24: 40000 [IU] via SUBCUTANEOUS

## 2017-12-18 ENCOUNTER — Encounter (HOSPITAL_COMMUNITY)
Admission: RE | Admit: 2017-12-18 | Discharge: 2017-12-18 | Disposition: A | Discharge status: Home / Self Care | Payer: Medicare Other | Source: Ambulatory Visit | Attending: Nephrology | Admitting: Nephrology

## 2017-12-18 VITALS — BP 124/53 | HR 83 | Temp 98.6°F | Resp 20

## 2017-12-18 DIAGNOSIS — D638 Anemia in other chronic diseases classified elsewhere: Secondary | ICD-10-CM

## 2017-12-18 DIAGNOSIS — N183 Chronic kidney disease, stage 3 (moderate): Secondary | ICD-10-CM | POA: Insufficient documentation

## 2017-12-18 DIAGNOSIS — D631 Anemia in chronic kidney disease: Secondary | ICD-10-CM | POA: Insufficient documentation

## 2017-12-18 LAB — POCT HEMOGLOBIN-HEMACUE: Hemoglobin: 9.7 g/dL — ABNORMAL LOW (ref 12.0–15.0)

## 2017-12-18 MED ORDER — EPOETIN ALFA 40000 UNIT/ML IJ SOLN
40000.0000 [IU] | INTRAMUSCULAR | Status: DC
Start: 2017-12-18 — End: 2017-12-19

## 2017-12-18 MED ORDER — EPOETIN ALFA 40000 UNIT/ML IJ SOLN
INTRAMUSCULAR | Status: AC
  Administered 2017-12-18: 40000 [IU] via SUBCUTANEOUS
  Filled 2017-12-18: qty 1

## 2018-01-22 ENCOUNTER — Encounter (HOSPITAL_COMMUNITY)
Admission: RE | Admit: 2018-01-22 | Discharge: 2018-01-22 | Disposition: A | Discharge status: Home / Self Care | Payer: Medicare Other | Source: Ambulatory Visit | Attending: Nephrology | Admitting: Nephrology

## 2018-01-22 DIAGNOSIS — D631 Anemia in chronic kidney disease: Secondary | ICD-10-CM | POA: Diagnosis present

## 2018-01-22 DIAGNOSIS — N183 Chronic kidney disease, stage 3 (moderate): Secondary | ICD-10-CM | POA: Insufficient documentation

## 2018-01-22 DIAGNOSIS — D638 Anemia in other chronic diseases classified elsewhere: Secondary | ICD-10-CM

## 2018-01-22 LAB — IRON AND TIBC
Iron: 146 ug/dL (ref 28–170)
Saturation Ratios: 45 % — ABNORMAL HIGH (ref 10.4–31.8)
TIBC: 323 ug/dL (ref 250–450)
UIBC: 177 ug/dL

## 2018-01-22 LAB — FERRITIN: Ferritin: 2058 ng/mL — ABNORMAL HIGH (ref 11–307)

## 2018-01-22 LAB — POCT HEMOGLOBIN-HEMACUE: Hemoglobin: 9.7 g/dL — ABNORMAL LOW (ref 12.0–15.0)

## 2018-01-22 MED ORDER — EPOETIN ALFA 40000 UNIT/ML IJ SOLN
40000.0000 [IU] | INTRAMUSCULAR | Status: DC
  Administered 2018-01-22: 40000 [IU] via SUBCUTANEOUS

## 2018-01-22 MED ORDER — EPOETIN ALFA 40000 UNIT/ML IJ SOLN
INTRAMUSCULAR | Status: AC
  Filled 2018-01-22: qty 1

## 2018-02-19 ENCOUNTER — Encounter (HOSPITAL_COMMUNITY)
Admission: RE | Admit: 2018-02-19 | Discharge: 2018-02-19 | Disposition: A | Discharge status: Home / Self Care | Payer: Medicare Other | Source: Ambulatory Visit | Attending: Nephrology | Admitting: Nephrology

## 2018-02-19 VITALS — BP 141/52 | HR 76 | Temp 98.6°F

## 2018-02-19 DIAGNOSIS — N183 Chronic kidney disease, stage 3 (moderate): Secondary | ICD-10-CM | POA: Diagnosis not present

## 2018-02-19 DIAGNOSIS — D631 Anemia in chronic kidney disease: Secondary | ICD-10-CM | POA: Insufficient documentation

## 2018-02-19 DIAGNOSIS — D638 Anemia in other chronic diseases classified elsewhere: Secondary | ICD-10-CM

## 2018-02-19 LAB — IRON AND TIBC
Iron: 227 ug/dL — ABNORMAL HIGH (ref 28–170)
Saturation Ratios: 77 % — ABNORMAL HIGH (ref 10.4–31.8)
TIBC: 294 ug/dL (ref 250–450)
UIBC: 67 ug/dL

## 2018-02-19 LAB — FERRITIN: Ferritin: 1723 ng/mL — ABNORMAL HIGH (ref 11–307)

## 2018-02-19 LAB — POCT HEMOGLOBIN-HEMACUE: Hemoglobin: 9.9 g/dL — ABNORMAL LOW (ref 12.0–15.0)

## 2018-02-19 MED ORDER — EPOETIN ALFA 40000 UNIT/ML IJ SOLN
INTRAMUSCULAR | Status: AC
Start: 2018-02-19 — End: 2018-02-19
  Administered 2018-02-19: 40000 [IU] via SUBCUTANEOUS
  Filled 2018-02-19: qty 1

## 2018-02-19 MED ORDER — EPOETIN ALFA 40000 UNIT/ML IJ SOLN
40000.0000 [IU] | INTRAMUSCULAR | Status: DC
  Administered 2018-02-19: 40000 [IU] via SUBCUTANEOUS

## 2018-03-25 ENCOUNTER — Other Ambulatory Visit (HOSPITAL_COMMUNITY): Payer: Self-pay | Admitting: *Deleted

## 2018-03-26 ENCOUNTER — Encounter (HOSPITAL_COMMUNITY)
Admission: RE | Admit: 2018-03-26 | Discharge: 2018-03-26 | Disposition: A | Discharge status: Home / Self Care | Payer: Medicare Other | Source: Ambulatory Visit | Attending: Nephrology | Admitting: Nephrology

## 2018-03-26 VITALS — BP 130/54 | HR 99 | Temp 99.1°F | Resp 20

## 2018-03-26 DIAGNOSIS — N183 Chronic kidney disease, stage 3 (moderate): Secondary | ICD-10-CM | POA: Diagnosis not present

## 2018-03-26 DIAGNOSIS — D638 Anemia in other chronic diseases classified elsewhere: Secondary | ICD-10-CM

## 2018-03-26 DIAGNOSIS — D631 Anemia in chronic kidney disease: Secondary | ICD-10-CM | POA: Insufficient documentation

## 2018-03-26 LAB — FERRITIN: Ferritin: 1677 ng/mL — ABNORMAL HIGH (ref 11–307)

## 2018-03-26 LAB — POCT HEMOGLOBIN-HEMACUE: Hemoglobin: 10.2 g/dL — ABNORMAL LOW (ref 12.0–15.0)

## 2018-03-26 LAB — IRON AND TIBC
Iron: 190 ug/dL — ABNORMAL HIGH (ref 28–170)
Saturation Ratios: 56 % — ABNORMAL HIGH (ref 10.4–31.8)
TIBC: 337 ug/dL (ref 250–450)
UIBC: 147 ug/dL

## 2018-03-26 MED ORDER — EPOETIN ALFA 40000 UNIT/ML IJ SOLN
40000.0000 [IU] | INTRAMUSCULAR | Status: DC
  Administered 2018-03-26: 40000 [IU] via SUBCUTANEOUS

## 2018-03-26 MED ORDER — EPOETIN ALFA 40000 UNIT/ML IJ SOLN
INTRAMUSCULAR | Status: AC
  Filled 2018-03-26: qty 1

## 2018-04-23 ENCOUNTER — Encounter (HOSPITAL_COMMUNITY)
Admission: RE | Admit: 2018-04-23 | Discharge: 2018-04-23 | Disposition: A | Discharge status: Home / Self Care | Payer: Medicare Other | Source: Ambulatory Visit | Attending: Nephrology | Admitting: Nephrology

## 2018-04-23 VITALS — BP 133/97 | HR 79 | Temp 98.3°F | Ht 59.0 in | Wt 110.0 lb

## 2018-04-23 DIAGNOSIS — N183 Chronic kidney disease, stage 3 (moderate): Secondary | ICD-10-CM | POA: Diagnosis not present

## 2018-04-23 DIAGNOSIS — D631 Anemia in chronic kidney disease: Secondary | ICD-10-CM | POA: Diagnosis present

## 2018-04-23 DIAGNOSIS — D638 Anemia in other chronic diseases classified elsewhere: Secondary | ICD-10-CM

## 2018-04-23 LAB — IRON AND TIBC
Iron: 212 ug/dL — ABNORMAL HIGH (ref 28–170)
Saturation Ratios: 62 % — ABNORMAL HIGH (ref 10.4–31.8)
TIBC: 340 ug/dL (ref 250–450)
UIBC: 128 ug/dL

## 2018-04-23 LAB — FERRITIN: Ferritin: 1569 ng/mL — ABNORMAL HIGH (ref 11–307)

## 2018-04-23 LAB — POCT HEMOGLOBIN-HEMACUE: Hemoglobin: 9.8 g/dL — ABNORMAL LOW (ref 12.0–15.0)

## 2018-04-23 MED ORDER — EPOETIN ALFA 40000 UNIT/ML IJ SOLN
40000.0000 [IU] | INTRAMUSCULAR | Status: DC
  Administered 2018-04-23: 40000 [IU] via SUBCUTANEOUS

## 2018-04-23 MED ORDER — EPOETIN ALFA 40000 UNIT/ML IJ SOLN
INTRAMUSCULAR | Status: AC
  Filled 2018-04-23: qty 1

## 2018-05-21 ENCOUNTER — Encounter (HOSPITAL_COMMUNITY)
Admission: RE | Admit: 2018-05-21 | Discharge: 2018-05-21 | Disposition: A | Discharge status: Home / Self Care | Payer: Medicare Other | Source: Ambulatory Visit | Attending: Nephrology | Admitting: Nephrology

## 2018-05-21 VITALS — BP 130/63 | HR 82 | Temp 98.6°F

## 2018-05-21 DIAGNOSIS — N183 Chronic kidney disease, stage 3 (moderate): Secondary | ICD-10-CM | POA: Insufficient documentation

## 2018-05-21 DIAGNOSIS — D631 Anemia in chronic kidney disease: Secondary | ICD-10-CM | POA: Diagnosis present

## 2018-05-21 DIAGNOSIS — D638 Anemia in other chronic diseases classified elsewhere: Secondary | ICD-10-CM

## 2018-05-21 LAB — IRON AND TIBC
Iron: 188 ug/dL — ABNORMAL HIGH (ref 28–170)
Saturation Ratios: 52 % — ABNORMAL HIGH (ref 10.4–31.8)
TIBC: 363 ug/dL (ref 250–450)
UIBC: 175 ug/dL

## 2018-05-21 LAB — FERRITIN: Ferritin: 1789 ng/mL — ABNORMAL HIGH (ref 11–307)

## 2018-05-21 MED ORDER — EPOETIN ALFA 40000 UNIT/ML IJ SOLN
40000.0000 [IU] | INTRAMUSCULAR | Status: DC
  Administered 2018-05-21: 40000 [IU] via SUBCUTANEOUS

## 2018-05-21 MED ORDER — EPOETIN ALFA 40000 UNIT/ML IJ SOLN
INTRAMUSCULAR | Status: AC
  Administered 2018-05-21: 40000 [IU] via SUBCUTANEOUS
  Filled 2018-05-21: qty 1

## 2018-05-24 LAB — POCT HEMOGLOBIN-HEMACUE: Hemoglobin: 10.1 g/dL — ABNORMAL LOW (ref 12.0–15.0)

## 2018-06-25 ENCOUNTER — Ambulatory Visit (HOSPITAL_COMMUNITY)
Admission: RE | Admit: 2018-06-25 | Discharge: 2018-06-25 | Disposition: A | Discharge status: Home / Self Care | Payer: Medicare Other | Source: Ambulatory Visit | Attending: Nephrology | Admitting: Nephrology

## 2018-06-25 VITALS — BP 127/56 | HR 93 | Temp 98.4°F | Resp 20

## 2018-06-25 DIAGNOSIS — D638 Anemia in other chronic diseases classified elsewhere: Secondary | ICD-10-CM

## 2018-06-25 DIAGNOSIS — D631 Anemia in chronic kidney disease: Secondary | ICD-10-CM | POA: Insufficient documentation

## 2018-06-25 DIAGNOSIS — N183 Chronic kidney disease, stage 3 (moderate): Secondary | ICD-10-CM | POA: Insufficient documentation

## 2018-06-25 LAB — IRON AND TIBC
Iron: 192 ug/dL — ABNORMAL HIGH (ref 28–170)
Saturation Ratios: 53 % — ABNORMAL HIGH (ref 10.4–31.8)
TIBC: 363 ug/dL (ref 250–450)
UIBC: 171 ug/dL

## 2018-06-25 LAB — POCT HEMOGLOBIN-HEMACUE: Hemoglobin: 10.1 g/dL — ABNORMAL LOW (ref 12.0–15.0)

## 2018-06-25 LAB — FERRITIN: Ferritin: 2122 ng/mL — ABNORMAL HIGH (ref 11–307)

## 2018-06-25 MED ORDER — EPOETIN ALFA 40000 UNIT/ML IJ SOLN
INTRAMUSCULAR | Status: AC
  Filled 2018-06-25: qty 1

## 2018-06-25 MED ORDER — EPOETIN ALFA 40000 UNIT/ML IJ SOLN
40000.0000 [IU] | INTRAMUSCULAR | Status: DC
  Administered 2018-06-25: 40000 [IU] via SUBCUTANEOUS

## 2018-07-23 ENCOUNTER — Ambulatory Visit (HOSPITAL_COMMUNITY)
Admission: RE | Admit: 2018-07-23 | Discharge: 2018-07-23 | Disposition: A | Discharge status: Home / Self Care | Payer: Medicare Other | Source: Ambulatory Visit | Attending: Nephrology | Admitting: Nephrology

## 2018-07-23 VITALS — BP 125/64 | HR 78

## 2018-07-23 DIAGNOSIS — D631 Anemia in chronic kidney disease: Secondary | ICD-10-CM | POA: Diagnosis present

## 2018-07-23 DIAGNOSIS — N183 Chronic kidney disease, stage 3 (moderate): Secondary | ICD-10-CM | POA: Insufficient documentation

## 2018-07-23 DIAGNOSIS — D638 Anemia in other chronic diseases classified elsewhere: Secondary | ICD-10-CM

## 2018-07-23 LAB — IRON AND TIBC
Iron: 168 ug/dL (ref 28–170)
Saturation Ratios: 51 % — ABNORMAL HIGH (ref 10.4–31.8)
TIBC: 330 ug/dL (ref 250–450)
UIBC: 162 ug/dL

## 2018-07-23 LAB — FERRITIN: Ferritin: 1798 ng/mL — ABNORMAL HIGH (ref 11–307)

## 2018-07-23 LAB — POCT HEMOGLOBIN-HEMACUE: Hemoglobin: 9.4 g/dL — ABNORMAL LOW (ref 12.0–15.0)

## 2018-07-23 MED ORDER — EPOETIN ALFA 40000 UNIT/ML IJ SOLN
INTRAMUSCULAR | Status: AC
  Administered 2018-07-23: 40000 [IU] via SUBCUTANEOUS
  Filled 2018-07-23: qty 1

## 2018-07-23 MED ORDER — EPOETIN ALFA 40000 UNIT/ML IJ SOLN
40000.0000 [IU] | INTRAMUSCULAR | Status: DC
  Administered 2018-07-23: 40000 [IU] via SUBCUTANEOUS

## 2018-08-20 ENCOUNTER — Ambulatory Visit (HOSPITAL_COMMUNITY)
Admission: RE | Admit: 2018-08-20 | Discharge: 2018-08-20 | Disposition: A | Discharge status: Home / Self Care | Payer: Medicare Other | Source: Ambulatory Visit | Attending: Nephrology | Admitting: Nephrology

## 2018-08-20 VITALS — BP 123/57 | HR 89 | Temp 97.8°F | Resp 20

## 2018-08-20 DIAGNOSIS — D631 Anemia in chronic kidney disease: Secondary | ICD-10-CM | POA: Insufficient documentation

## 2018-08-20 DIAGNOSIS — N183 Chronic kidney disease, stage 3 (moderate): Secondary | ICD-10-CM | POA: Insufficient documentation

## 2018-08-20 DIAGNOSIS — D638 Anemia in other chronic diseases classified elsewhere: Secondary | ICD-10-CM

## 2018-08-20 LAB — IRON AND TIBC
Iron: 199 ug/dL — ABNORMAL HIGH (ref 28–170)
Saturation Ratios: 54 % — ABNORMAL HIGH (ref 10.4–31.8)
TIBC: 365 ug/dL (ref 250–450)
UIBC: 166 ug/dL

## 2018-08-20 LAB — POCT HEMOGLOBIN-HEMACUE: Hemoglobin: 9.7 g/dL — ABNORMAL LOW (ref 12.0–15.0)

## 2018-08-20 LAB — FERRITIN: Ferritin: 2568 ng/mL — ABNORMAL HIGH (ref 11–307)

## 2018-08-20 MED ORDER — EPOETIN ALFA 40000 UNIT/ML IJ SOLN
40000.0000 [IU] | INTRAMUSCULAR | Status: DC
  Administered 2018-08-20: 40000 [IU] via SUBCUTANEOUS

## 2018-08-20 MED ORDER — EPOETIN ALFA 40000 UNIT/ML IJ SOLN
INTRAMUSCULAR | Status: AC
  Filled 2018-08-20: qty 1

## 2018-09-10 ENCOUNTER — Encounter (HOSPITAL_COMMUNITY): Payer: Medicare Other

## 2018-09-15 ENCOUNTER — Other Ambulatory Visit (HOSPITAL_COMMUNITY): Payer: Self-pay

## 2018-09-17 ENCOUNTER — Encounter (HOSPITAL_COMMUNITY)
Admission: RE | Admit: 2018-09-17 | Discharge: 2018-09-17 | Disposition: A | Discharge status: Home / Self Care | Payer: Medicare Other | Source: Ambulatory Visit | Attending: Nephrology | Admitting: Nephrology

## 2018-09-17 VITALS — BP 128/54 | HR 9 | Temp 98.4°F | Resp 20

## 2018-09-17 DIAGNOSIS — D638 Anemia in other chronic diseases classified elsewhere: Secondary | ICD-10-CM

## 2018-09-17 DIAGNOSIS — D631 Anemia in chronic kidney disease: Secondary | ICD-10-CM | POA: Diagnosis present

## 2018-09-17 DIAGNOSIS — N183 Chronic kidney disease, stage 3 (moderate): Secondary | ICD-10-CM | POA: Diagnosis present

## 2018-09-17 LAB — POCT HEMOGLOBIN-HEMACUE: Hemoglobin: 9.3 g/dL — ABNORMAL LOW (ref 12.0–15.0)

## 2018-09-17 MED ORDER — EPOETIN ALFA 40000 UNIT/ML IJ SOLN
40000.0000 [IU] | INTRAMUSCULAR | Status: DC
  Administered 2018-09-17: 40000 [IU] via SUBCUTANEOUS

## 2018-09-17 MED ORDER — EPOETIN ALFA 40000 UNIT/ML IJ SOLN
INTRAMUSCULAR | Status: AC
  Filled 2018-09-17: qty 1

## 2018-10-15 ENCOUNTER — Encounter (HOSPITAL_COMMUNITY)
Admission: RE | Admit: 2018-10-15 | Discharge: 2018-10-15 | Disposition: A | Discharge status: Home / Self Care | Payer: Medicare Other | Source: Ambulatory Visit | Attending: Nephrology | Admitting: Nephrology

## 2018-10-15 VITALS — BP 126/65 | HR 65 | Temp 97.8°F | Resp 20 | Ht 61.0 in | Wt 144.0 lb

## 2018-10-15 DIAGNOSIS — N183 Chronic kidney disease, stage 3 (moderate): Secondary | ICD-10-CM | POA: Insufficient documentation

## 2018-10-15 DIAGNOSIS — D638 Anemia in other chronic diseases classified elsewhere: Secondary | ICD-10-CM

## 2018-10-15 DIAGNOSIS — D631 Anemia in chronic kidney disease: Secondary | ICD-10-CM | POA: Diagnosis present

## 2018-10-15 LAB — FERRITIN: Ferritin: 1922 ng/mL — ABNORMAL HIGH (ref 11–307)

## 2018-10-15 LAB — IRON AND TIBC
Iron: 154 ug/dL (ref 28–170)
Saturation Ratios: 49 % — ABNORMAL HIGH (ref 10.4–31.8)
TIBC: 315 ug/dL (ref 250–450)
UIBC: 161 ug/dL

## 2018-10-15 LAB — POCT HEMOGLOBIN-HEMACUE: Hemoglobin: 8.7 g/dL — ABNORMAL LOW (ref 12.0–15.0)

## 2018-10-15 MED ORDER — EPOETIN ALFA 40000 UNIT/ML IJ SOLN
INTRAMUSCULAR | Status: AC
  Filled 2018-10-15: qty 1

## 2018-10-15 MED ORDER — EPOETIN ALFA 40000 UNIT/ML IJ SOLN
40000.0000 [IU] | INTRAMUSCULAR | Status: DC
  Administered 2018-10-15: 40000 [IU] via SUBCUTANEOUS

## 2018-11-12 ENCOUNTER — Ambulatory Visit (HOSPITAL_COMMUNITY)
Admission: RE | Admit: 2018-11-12 | Discharge: 2018-11-12 | Disposition: A | Discharge status: Home / Self Care | Payer: Medicare Other | Source: Ambulatory Visit | Attending: Nephrology | Admitting: Nephrology

## 2018-11-12 VITALS — BP 140/57 | HR 87 | Temp 98.8°F | Resp 20

## 2018-11-12 DIAGNOSIS — D638 Anemia in other chronic diseases classified elsewhere: Secondary | ICD-10-CM | POA: Diagnosis not present

## 2018-11-12 LAB — IRON AND TIBC
Iron: 181 ug/dL — ABNORMAL HIGH (ref 28–170)
Saturation Ratios: 51 % — ABNORMAL HIGH (ref 10.4–31.8)
TIBC: 354 ug/dL (ref 250–450)
UIBC: 173 ug/dL

## 2018-11-12 LAB — POCT HEMOGLOBIN-HEMACUE: Hemoglobin: 9.6 g/dL — ABNORMAL LOW (ref 12.0–15.0)

## 2018-11-12 LAB — FERRITIN: Ferritin: 2625 ng/mL — ABNORMAL HIGH (ref 11–307)

## 2018-11-12 MED ORDER — EPOETIN ALFA 40000 UNIT/ML IJ SOLN
40000.0000 [IU] | INTRAMUSCULAR | Status: DC
  Administered 2018-11-12: 40000 [IU] via SUBCUTANEOUS

## 2018-11-12 MED ORDER — EPOETIN ALFA 40000 UNIT/ML IJ SOLN
INTRAMUSCULAR | Status: AC
  Filled 2018-11-12: qty 1

## 2018-12-10 ENCOUNTER — Encounter (HOSPITAL_COMMUNITY): Payer: Medicare Other

## 2018-12-15 ENCOUNTER — Ambulatory Visit (HOSPITAL_COMMUNITY)
Admission: RE | Admit: 2018-12-15 | Discharge: 2018-12-15 | Disposition: A | Discharge status: Home / Self Care | Payer: Medicare Other | Source: Ambulatory Visit | Attending: Nephrology | Admitting: Nephrology

## 2018-12-15 VITALS — BP 138/55 | HR 81 | Temp 98.3°F | Resp 20

## 2018-12-15 DIAGNOSIS — D638 Anemia in other chronic diseases classified elsewhere: Secondary | ICD-10-CM | POA: Insufficient documentation

## 2018-12-15 LAB — IRON AND TIBC
Iron: 162 ug/dL (ref 28–170)
Saturation Ratios: 50 % — ABNORMAL HIGH (ref 10.4–31.8)
TIBC: 323 ug/dL (ref 250–450)
UIBC: 161 ug/dL

## 2018-12-15 LAB — POCT HEMOGLOBIN-HEMACUE: Hemoglobin: 9 g/dL — ABNORMAL LOW (ref 12.0–15.0)

## 2018-12-15 LAB — FERRITIN: Ferritin: 2853 ng/mL — ABNORMAL HIGH (ref 11–307)

## 2018-12-15 MED ORDER — EPOETIN ALFA 40000 UNIT/ML IJ SOLN
INTRAMUSCULAR | Status: AC
  Filled 2018-12-15: qty 1

## 2018-12-15 MED ORDER — EPOETIN ALFA 40000 UNIT/ML IJ SOLN
40000.0000 [IU] | INTRAMUSCULAR | Status: DC
  Administered 2018-12-15: 40000 [IU] via SUBCUTANEOUS

## 2019-01-12 ENCOUNTER — Other Ambulatory Visit: Payer: Self-pay

## 2019-01-12 ENCOUNTER — Ambulatory Visit (HOSPITAL_COMMUNITY)
Admission: RE | Admit: 2019-01-12 | Discharge: 2019-01-12 | Disposition: A | Discharge status: Home / Self Care | Payer: Medicare Other | Source: Ambulatory Visit | Attending: Nephrology | Admitting: Nephrology

## 2019-01-12 VITALS — BP 158/68 | HR 95 | Temp 98.5°F | Resp 20

## 2019-01-12 DIAGNOSIS — D638 Anemia in other chronic diseases classified elsewhere: Secondary | ICD-10-CM | POA: Insufficient documentation

## 2019-01-12 LAB — POCT HEMOGLOBIN-HEMACUE: Hemoglobin: 9.6 g/dL — ABNORMAL LOW (ref 12.0–15.0)

## 2019-01-12 LAB — IRON AND TIBC
Iron: 108 ug/dL (ref 28–170)
Saturation Ratios: 31 % (ref 10.4–31.8)
TIBC: 353 ug/dL (ref 250–450)
UIBC: 245 ug/dL

## 2019-01-12 LAB — FERRITIN: Ferritin: 1656 ng/mL — ABNORMAL HIGH (ref 11–307)

## 2019-01-12 MED ORDER — EPOETIN ALFA 20000 UNIT/ML IJ SOLN
INTRAMUSCULAR | Status: AC
  Filled 2019-01-12: qty 1

## 2019-01-12 MED ORDER — EPOETIN ALFA 20000 UNIT/ML IJ SOLN
20000.0000 [IU] | INTRAMUSCULAR | Status: DC
  Administered 2019-01-12: 20000 [IU] via SUBCUTANEOUS

## 2019-01-26 ENCOUNTER — Other Ambulatory Visit: Payer: Self-pay

## 2019-01-26 ENCOUNTER — Ambulatory Visit (HOSPITAL_COMMUNITY)
Admission: RE | Admit: 2019-01-26 | Discharge: 2019-01-26 | Disposition: A | Discharge status: Home / Self Care | Payer: Medicare Other | Source: Ambulatory Visit | Attending: Nephrology | Admitting: Nephrology

## 2019-01-26 VITALS — BP 135/61 | HR 88 | Temp 98.2°F | Resp 20

## 2019-01-26 DIAGNOSIS — D638 Anemia in other chronic diseases classified elsewhere: Secondary | ICD-10-CM | POA: Diagnosis present

## 2019-01-26 LAB — POCT HEMOGLOBIN-HEMACUE: Hemoglobin: 10.4 g/dL — ABNORMAL LOW (ref 12.0–15.0)

## 2019-01-26 MED ORDER — EPOETIN ALFA 20000 UNIT/ML IJ SOLN
20000.0000 [IU] | INTRAMUSCULAR | Status: DC
  Administered 2019-01-26: 20000 [IU] via SUBCUTANEOUS

## 2019-01-26 MED ORDER — EPOETIN ALFA 20000 UNIT/ML IJ SOLN
INTRAMUSCULAR | Status: AC
  Filled 2019-01-26: qty 1

## 2019-02-09 ENCOUNTER — Ambulatory Visit (HOSPITAL_COMMUNITY)
Admission: RE | Admit: 2019-02-09 | Discharge: 2019-02-09 | Disposition: A | Discharge status: Home / Self Care | Payer: Medicare Other | Source: Ambulatory Visit | Attending: Nephrology | Admitting: Nephrology

## 2019-02-09 ENCOUNTER — Other Ambulatory Visit: Payer: Self-pay

## 2019-02-09 VITALS — BP 135/65 | HR 82 | Temp 97.2°F | Resp 20

## 2019-02-09 DIAGNOSIS — D638 Anemia in other chronic diseases classified elsewhere: Secondary | ICD-10-CM | POA: Insufficient documentation

## 2019-02-09 LAB — IRON AND TIBC
Iron: 120 ug/dL (ref 28–170)
Saturation Ratios: 35 % — ABNORMAL HIGH (ref 10.4–31.8)
TIBC: 343 ug/dL (ref 250–450)
UIBC: 223 ug/dL

## 2019-02-09 LAB — POCT HEMOGLOBIN-HEMACUE: Hemoglobin: 10.4 g/dL — ABNORMAL LOW (ref 12.0–15.0)

## 2019-02-09 LAB — FERRITIN: Ferritin: 1755 ng/mL — ABNORMAL HIGH (ref 11–307)

## 2019-02-09 MED ORDER — EPOETIN ALFA 20000 UNIT/ML IJ SOLN
20000.0000 [IU] | INTRAMUSCULAR | Status: DC
  Administered 2019-02-09: 20000 [IU] via SUBCUTANEOUS

## 2019-02-09 MED ORDER — EPOETIN ALFA 20000 UNIT/ML IJ SOLN
INTRAMUSCULAR | Status: AC
  Filled 2019-02-09: qty 1

## 2019-02-23 ENCOUNTER — Other Ambulatory Visit: Payer: Self-pay

## 2019-02-23 ENCOUNTER — Ambulatory Visit (HOSPITAL_COMMUNITY)
Admission: RE | Admit: 2019-02-23 | Discharge: 2019-02-23 | Disposition: A | Discharge status: Home / Self Care | Payer: Medicare Other | Source: Ambulatory Visit | Attending: Nephrology | Admitting: Nephrology

## 2019-02-23 VITALS — BP 138/61 | HR 87 | Temp 98.2°F

## 2019-02-23 DIAGNOSIS — D638 Anemia in other chronic diseases classified elsewhere: Secondary | ICD-10-CM | POA: Diagnosis present

## 2019-02-23 LAB — POCT HEMOGLOBIN-HEMACUE: Hemoglobin: 10.1 g/dL — ABNORMAL LOW (ref 12.0–15.0)

## 2019-02-23 MED ORDER — EPOETIN ALFA 20000 UNIT/ML IJ SOLN
INTRAMUSCULAR | Status: AC
  Filled 2019-02-23: qty 1

## 2019-02-23 MED ORDER — EPOETIN ALFA 20000 UNIT/ML IJ SOLN
20000.0000 [IU] | INTRAMUSCULAR | Status: DC
  Administered 2019-02-23: 20000 [IU] via SUBCUTANEOUS

## 2019-03-09 ENCOUNTER — Other Ambulatory Visit: Payer: Self-pay

## 2019-03-09 ENCOUNTER — Ambulatory Visit (HOSPITAL_COMMUNITY)
Admission: RE | Admit: 2019-03-09 | Discharge: 2019-03-09 | Disposition: A | Discharge status: Home / Self Care | Payer: Medicare Other | Source: Ambulatory Visit | Attending: Nephrology | Admitting: Nephrology

## 2019-03-09 VITALS — BP 130/65 | HR 103 | Temp 98.0°F | Resp 20

## 2019-03-09 DIAGNOSIS — D638 Anemia in other chronic diseases classified elsewhere: Secondary | ICD-10-CM | POA: Insufficient documentation

## 2019-03-09 LAB — FERRITIN: Ferritin: 1610 ng/mL — ABNORMAL HIGH (ref 11–307)

## 2019-03-09 LAB — IRON AND TIBC
Iron: 147 ug/dL (ref 28–170)
Saturation Ratios: 38 % — ABNORMAL HIGH (ref 10.4–31.8)
TIBC: 382 ug/dL (ref 250–450)
UIBC: 235 ug/dL

## 2019-03-09 LAB — POCT HEMOGLOBIN-HEMACUE: Hemoglobin: 11.7 g/dL — ABNORMAL LOW (ref 12.0–15.0)

## 2019-03-09 MED ORDER — EPOETIN ALFA 20000 UNIT/ML IJ SOLN
20000.0000 [IU] | INTRAMUSCULAR | Status: DC
  Administered 2019-03-09: 11:00:00 20000 [IU] via SUBCUTANEOUS

## 2019-03-09 MED ORDER — EPOETIN ALFA 20000 UNIT/ML IJ SOLN
INTRAMUSCULAR | Status: AC
  Administered 2019-03-09: 20000 [IU] via SUBCUTANEOUS
  Filled 2019-03-09: qty 1

## 2019-03-23 ENCOUNTER — Ambulatory Visit (HOSPITAL_COMMUNITY)
Admission: RE | Admit: 2019-03-23 | Discharge: 2019-03-23 | Disposition: A | Discharge status: Home / Self Care | Payer: Medicare Other | Source: Ambulatory Visit | Attending: Nephrology | Admitting: Nephrology

## 2019-03-23 ENCOUNTER — Other Ambulatory Visit: Payer: Self-pay

## 2019-03-23 VITALS — BP 162/77 | HR 92 | Temp 97.7°F | Resp 20

## 2019-03-23 DIAGNOSIS — D638 Anemia in other chronic diseases classified elsewhere: Secondary | ICD-10-CM | POA: Insufficient documentation

## 2019-03-23 LAB — POCT HEMOGLOBIN-HEMACUE: Hemoglobin: 11.1 g/dL — ABNORMAL LOW (ref 12.0–15.0)

## 2019-03-23 MED ORDER — EPOETIN ALFA 20000 UNIT/ML IJ SOLN
20000.0000 [IU] | INTRAMUSCULAR | Status: DC
  Administered 2019-03-23: 20000 [IU] via SUBCUTANEOUS

## 2019-03-23 MED ORDER — EPOETIN ALFA 20000 UNIT/ML IJ SOLN
INTRAMUSCULAR | Status: AC
  Filled 2019-03-23: qty 1

## 2019-04-06 ENCOUNTER — Ambulatory Visit (HOSPITAL_COMMUNITY)
Admission: RE | Admit: 2019-04-06 | Discharge: 2019-04-06 | Disposition: A | Discharge status: Home / Self Care | Payer: Medicare Other | Source: Ambulatory Visit | Attending: Nephrology | Admitting: Nephrology

## 2019-04-06 ENCOUNTER — Other Ambulatory Visit: Payer: Self-pay

## 2019-04-06 VITALS — BP 104/60 | HR 94 | Temp 98.3°F | Resp 20

## 2019-04-06 DIAGNOSIS — D638 Anemia in other chronic diseases classified elsewhere: Secondary | ICD-10-CM | POA: Insufficient documentation

## 2019-04-06 LAB — IRON AND TIBC
Iron: 184 ug/dL — ABNORMAL HIGH (ref 28–170)
Saturation Ratios: 55 % — ABNORMAL HIGH (ref 10.4–31.8)
TIBC: 336 ug/dL (ref 250–450)
UIBC: 152 ug/dL

## 2019-04-06 LAB — POCT HEMOGLOBIN-HEMACUE: Hemoglobin: 11.2 g/dL — ABNORMAL LOW (ref 12.0–15.0)

## 2019-04-06 LAB — FERRITIN: Ferritin: 1584 ng/mL — ABNORMAL HIGH (ref 11–307)

## 2019-04-06 MED ORDER — EPOETIN ALFA 20000 UNIT/ML IJ SOLN
20000.0000 [IU] | INTRAMUSCULAR | Status: DC
  Administered 2019-04-06: 11:00:00 20000 [IU] via SUBCUTANEOUS

## 2019-04-06 MED ORDER — EPOETIN ALFA 20000 UNIT/ML IJ SOLN
INTRAMUSCULAR | Status: AC
  Administered 2019-04-06: 20000 [IU] via SUBCUTANEOUS
  Filled 2019-04-06: qty 1

## 2019-04-20 ENCOUNTER — Encounter (HOSPITAL_COMMUNITY)
Admission: RE | Admit: 2019-04-20 | Discharge: 2019-04-20 | Disposition: A | Discharge status: Home / Self Care | Payer: Medicare Other | Source: Ambulatory Visit | Attending: Nephrology | Admitting: Nephrology

## 2019-04-20 ENCOUNTER — Other Ambulatory Visit: Payer: Self-pay

## 2019-04-20 VITALS — BP 141/61 | HR 79 | Temp 97.3°F | Resp 20

## 2019-04-20 DIAGNOSIS — D638 Anemia in other chronic diseases classified elsewhere: Secondary | ICD-10-CM | POA: Insufficient documentation

## 2019-04-20 LAB — POCT HEMOGLOBIN-HEMACUE: Hemoglobin: 10.8 g/dL — ABNORMAL LOW (ref 12.0–15.0)

## 2019-04-20 MED ORDER — EPOETIN ALFA 20000 UNIT/ML IJ SOLN
INTRAMUSCULAR | Status: AC
  Filled 2019-04-20: qty 1

## 2019-04-20 MED ORDER — EPOETIN ALFA 20000 UNIT/ML IJ SOLN
20000.0000 [IU] | INTRAMUSCULAR | Status: DC
  Administered 2019-04-20: 20000 [IU] via SUBCUTANEOUS

## 2019-05-04 ENCOUNTER — Other Ambulatory Visit: Payer: Self-pay

## 2019-05-04 ENCOUNTER — Ambulatory Visit (HOSPITAL_COMMUNITY)
Admission: RE | Admit: 2019-05-04 | Discharge: 2019-05-04 | Disposition: A | Discharge status: Home / Self Care | Payer: Medicare Other | Source: Ambulatory Visit | Attending: Nephrology | Admitting: Nephrology

## 2019-05-04 VITALS — BP 124/61 | HR 78 | Temp 97.9°F | Resp 18

## 2019-05-04 DIAGNOSIS — D638 Anemia in other chronic diseases classified elsewhere: Secondary | ICD-10-CM | POA: Diagnosis present

## 2019-05-04 LAB — IRON AND TIBC
Iron: 170 ug/dL (ref 28–170)
Saturation Ratios: 51 % — ABNORMAL HIGH (ref 10.4–31.8)
TIBC: 333 ug/dL (ref 250–450)
UIBC: 163 ug/dL

## 2019-05-04 LAB — FERRITIN: Ferritin: 1916 ng/mL — ABNORMAL HIGH (ref 11–307)

## 2019-05-04 LAB — POCT HEMOGLOBIN-HEMACUE: Hemoglobin: 10.9 g/dL — ABNORMAL LOW (ref 12.0–15.0)

## 2019-05-04 MED ORDER — EPOETIN ALFA 20000 UNIT/ML IJ SOLN: 20000.0000 [IU] | INTRAMUSCULAR | Status: DC

## 2019-05-04 MED ORDER — EPOETIN ALFA 20000 UNIT/ML IJ SOLN
INTRAMUSCULAR | Status: AC
  Administered 2019-05-04: 20000 [IU] via SUBCUTANEOUS
  Filled 2019-05-04: qty 1

## 2019-05-18 ENCOUNTER — Encounter (HOSPITAL_COMMUNITY)
Admission: RE | Admit: 2019-05-18 | Discharge: 2019-05-18 | Disposition: A | Discharge status: Home / Self Care | Payer: Medicare Other | Source: Ambulatory Visit | Attending: Nephrology | Admitting: Nephrology

## 2019-05-18 ENCOUNTER — Other Ambulatory Visit: Payer: Self-pay

## 2019-05-18 VITALS — BP 124/52 | HR 73 | Temp 97.7°F | Resp 18

## 2019-05-18 DIAGNOSIS — D638 Anemia in other chronic diseases classified elsewhere: Secondary | ICD-10-CM | POA: Diagnosis not present

## 2019-05-18 LAB — POCT HEMOGLOBIN-HEMACUE: Hemoglobin: 10.3 g/dL — ABNORMAL LOW (ref 12.0–15.0)

## 2019-05-18 MED ORDER — EPOETIN ALFA 20000 UNIT/ML IJ SOLN
20000.0000 [IU] | INTRAMUSCULAR | Status: DC
  Administered 2019-05-18: 20000 [IU] via SUBCUTANEOUS

## 2019-05-18 MED ORDER — EPOETIN ALFA 20000 UNIT/ML IJ SOLN
INTRAMUSCULAR | Status: AC
  Filled 2019-05-18: qty 1

## 2019-06-01 ENCOUNTER — Encounter (HOSPITAL_COMMUNITY): Payer: Medicare Other

## 2019-06-01 ENCOUNTER — Other Ambulatory Visit: Payer: Self-pay

## 2019-06-01 ENCOUNTER — Ambulatory Visit (HOSPITAL_COMMUNITY)
Admission: RE | Admit: 2019-06-01 | Discharge: 2019-06-01 | Disposition: A | Discharge status: Home / Self Care | Payer: Medicare Other | Source: Ambulatory Visit | Attending: Nephrology | Admitting: Nephrology

## 2019-06-01 VITALS — BP 121/53 | HR 72 | Temp 97.5°F | Resp 18

## 2019-06-01 DIAGNOSIS — D638 Anemia in other chronic diseases classified elsewhere: Secondary | ICD-10-CM

## 2019-06-01 LAB — FERRITIN: Ferritin: 1856 ng/mL — ABNORMAL HIGH (ref 11–307)

## 2019-06-01 LAB — POCT HEMOGLOBIN-HEMACUE: Hemoglobin: 10.7 g/dL — ABNORMAL LOW (ref 12.0–15.0)

## 2019-06-01 LAB — IRON AND TIBC
Iron: 121 ug/dL (ref 28–170)
Saturation Ratios: 39 % — ABNORMAL HIGH (ref 10.4–31.8)
TIBC: 312 ug/dL (ref 250–450)
UIBC: 191 ug/dL

## 2019-06-01 MED ORDER — EPOETIN ALFA 20000 UNIT/ML IJ SOLN
INTRAMUSCULAR | Status: AC
  Filled 2019-06-01: qty 1

## 2019-06-01 MED ORDER — EPOETIN ALFA 20000 UNIT/ML IJ SOLN
20000.0000 [IU] | INTRAMUSCULAR | Status: DC
  Administered 2019-06-01: 20000 [IU] via SUBCUTANEOUS

## 2019-06-15 ENCOUNTER — Encounter (HOSPITAL_COMMUNITY)
Admission: RE | Admit: 2019-06-15 | Discharge: 2019-06-15 | Disposition: A | Discharge status: Home / Self Care | Payer: Medicare Other | Source: Ambulatory Visit | Attending: Nephrology | Admitting: Nephrology

## 2019-06-15 ENCOUNTER — Other Ambulatory Visit: Payer: Self-pay

## 2019-06-15 VITALS — BP 136/66 | HR 84 | Temp 97.2°F | Resp 20

## 2019-06-15 DIAGNOSIS — D638 Anemia in other chronic diseases classified elsewhere: Secondary | ICD-10-CM | POA: Insufficient documentation

## 2019-06-15 LAB — POCT HEMOGLOBIN-HEMACUE: Hemoglobin: 10.9 g/dL — ABNORMAL LOW (ref 12.0–15.0)

## 2019-06-15 MED ORDER — EPOETIN ALFA 20000 UNIT/ML IJ SOLN
20000.0000 [IU] | INTRAMUSCULAR | Status: DC
  Administered 2019-06-15: 20000 [IU] via SUBCUTANEOUS

## 2019-06-15 MED ORDER — EPOETIN ALFA 20000 UNIT/ML IJ SOLN
INTRAMUSCULAR | Status: AC
  Filled 2019-06-15: qty 1

## 2019-06-29 ENCOUNTER — Other Ambulatory Visit: Payer: Self-pay

## 2019-06-29 ENCOUNTER — Encounter (HOSPITAL_COMMUNITY)
Admission: RE | Admit: 2019-06-29 | Discharge: 2019-06-29 | Disposition: A | Discharge status: Home / Self Care | Payer: Medicare Other | Source: Ambulatory Visit | Attending: Nephrology | Admitting: Nephrology

## 2019-06-29 VITALS — BP 158/56 | HR 72 | Temp 96.5°F | Resp 20

## 2019-06-29 DIAGNOSIS — D638 Anemia in other chronic diseases classified elsewhere: Secondary | ICD-10-CM | POA: Diagnosis present

## 2019-06-29 LAB — IRON AND TIBC
Iron: 167 ug/dL (ref 28–170)
Saturation Ratios: 54 % — ABNORMAL HIGH (ref 10.4–31.8)
TIBC: 309 ug/dL (ref 250–450)
UIBC: 142 ug/dL

## 2019-06-29 LAB — POCT HEMOGLOBIN-HEMACUE: Hemoglobin: 10.9 g/dL — ABNORMAL LOW (ref 12.0–15.0)

## 2019-06-29 LAB — FERRITIN: Ferritin: 1550 ng/mL — ABNORMAL HIGH (ref 11–307)

## 2019-06-29 MED ORDER — EPOETIN ALFA 20000 UNIT/ML IJ SOLN: 20000.0000 [IU] | INTRAMUSCULAR | Status: DC

## 2019-06-29 MED ORDER — EPOETIN ALFA 20000 UNIT/ML IJ SOLN
INTRAMUSCULAR | Status: AC
  Administered 2019-06-29: 20000 [IU]
  Filled 2019-06-29: qty 1

## 2019-07-13 ENCOUNTER — Ambulatory Visit (HOSPITAL_COMMUNITY)
Admission: RE | Admit: 2019-07-13 | Discharge: 2019-07-13 | Disposition: A | Discharge status: Home / Self Care | Payer: Medicare Other | Source: Ambulatory Visit | Attending: Nephrology | Admitting: Nephrology

## 2019-07-13 ENCOUNTER — Other Ambulatory Visit: Payer: Self-pay

## 2019-07-13 VITALS — BP 139/63 | HR 74 | Temp 97.4°F

## 2019-07-13 DIAGNOSIS — D638 Anemia in other chronic diseases classified elsewhere: Secondary | ICD-10-CM | POA: Diagnosis not present

## 2019-07-13 LAB — POCT HEMOGLOBIN-HEMACUE: Hemoglobin: 11.4 g/dL — ABNORMAL LOW (ref 12.0–15.0)

## 2019-07-13 MED ORDER — EPOETIN ALFA 20000 UNIT/ML IJ SOLN
INTRAMUSCULAR | Status: AC
  Administered 2019-07-13: 20000 [IU]
  Filled 2019-07-13: qty 1

## 2019-07-13 MED ORDER — EPOETIN ALFA 20000 UNIT/ML IJ SOLN: 20000.0000 [IU] | INTRAMUSCULAR | Status: DC

## 2019-07-27 ENCOUNTER — Encounter (HOSPITAL_COMMUNITY)
Admission: RE | Admit: 2019-07-27 | Discharge: 2019-07-27 | Disposition: A | Discharge status: Home / Self Care | Payer: Medicare Other | Source: Ambulatory Visit | Attending: Nephrology | Admitting: Nephrology

## 2019-07-27 ENCOUNTER — Other Ambulatory Visit: Payer: Self-pay

## 2019-07-27 VITALS — BP 137/68 | HR 82 | Temp 97.3°F | Resp 20

## 2019-07-27 DIAGNOSIS — D638 Anemia in other chronic diseases classified elsewhere: Secondary | ICD-10-CM | POA: Diagnosis not present

## 2019-07-27 LAB — POCT HEMOGLOBIN-HEMACUE: Hemoglobin: 11.1 g/dL — ABNORMAL LOW (ref 12.0–15.0)

## 2019-07-27 LAB — IRON AND TIBC
Iron: 159 ug/dL (ref 28–170)
Saturation Ratios: 44 % — ABNORMAL HIGH (ref 10.4–31.8)
TIBC: 358 ug/dL (ref 250–450)
UIBC: 199 ug/dL

## 2019-07-27 LAB — FERRITIN: Ferritin: 1829 ng/mL — ABNORMAL HIGH (ref 11–307)

## 2019-07-27 MED ORDER — EPOETIN ALFA 20000 UNIT/ML IJ SOLN
INTRAMUSCULAR | Status: AC
  Filled 2019-07-27: qty 1

## 2019-07-27 MED ORDER — EPOETIN ALFA 20000 UNIT/ML IJ SOLN
20000.0000 [IU] | INTRAMUSCULAR | Status: DC
  Administered 2019-07-27: 10:00:00 20000 [IU] via SUBCUTANEOUS

## 2019-08-10 ENCOUNTER — Ambulatory Visit (HOSPITAL_COMMUNITY)
Admission: RE | Admit: 2019-08-10 | Discharge: 2019-08-10 | Disposition: A | Discharge status: Home / Self Care | Payer: Medicare Other | Source: Ambulatory Visit | Attending: Nephrology | Admitting: Nephrology

## 2019-08-10 ENCOUNTER — Other Ambulatory Visit: Payer: Self-pay

## 2019-08-10 VITALS — BP 144/63 | HR 81 | Temp 97.3°F | Resp 20

## 2019-08-10 DIAGNOSIS — D638 Anemia in other chronic diseases classified elsewhere: Secondary | ICD-10-CM | POA: Diagnosis present

## 2019-08-10 LAB — POCT HEMOGLOBIN-HEMACUE: Hemoglobin: 11.4 g/dL — ABNORMAL LOW (ref 12.0–15.0)

## 2019-08-10 MED ORDER — EPOETIN ALFA 20000 UNIT/ML IJ SOLN
INTRAMUSCULAR | Status: AC
  Filled 2019-08-10: qty 1

## 2019-08-10 MED ORDER — EPOETIN ALFA 20000 UNIT/ML IJ SOLN
20000.0000 [IU] | INTRAMUSCULAR | Status: DC
  Administered 2019-08-10: 20000 [IU] via SUBCUTANEOUS

## 2019-08-24 ENCOUNTER — Other Ambulatory Visit: Payer: Self-pay

## 2019-08-24 ENCOUNTER — Ambulatory Visit (HOSPITAL_COMMUNITY)
Admission: RE | Admit: 2019-08-24 | Discharge: 2019-08-24 | Disposition: A | Discharge status: Home / Self Care | Payer: Medicare Other | Source: Ambulatory Visit | Attending: Nephrology | Admitting: Nephrology

## 2019-08-24 VITALS — BP 127/54 | HR 87 | Temp 97.0°F | Resp 20

## 2019-08-24 DIAGNOSIS — D638 Anemia in other chronic diseases classified elsewhere: Secondary | ICD-10-CM | POA: Diagnosis not present

## 2019-08-24 LAB — IRON AND TIBC
Iron: 181 ug/dL — ABNORMAL HIGH (ref 28–170)
Saturation Ratios: 55 % — ABNORMAL HIGH (ref 10.4–31.8)
TIBC: 330 ug/dL (ref 250–450)
UIBC: 149 ug/dL

## 2019-08-24 LAB — FERRITIN: Ferritin: 1820 ng/mL — ABNORMAL HIGH (ref 11–307)

## 2019-08-24 MED ORDER — EPOETIN ALFA 20000 UNIT/ML IJ SOLN
20000.0000 [IU] | INTRAMUSCULAR | Status: DC
  Administered 2019-08-24: 10:00:00 20000 [IU] via SUBCUTANEOUS

## 2019-08-24 MED ORDER — EPOETIN ALFA 20000 UNIT/ML IJ SOLN
INTRAMUSCULAR | Status: AC
  Administered 2019-08-24: 20000 [IU] via SUBCUTANEOUS
  Filled 2019-08-24: qty 1

## 2019-08-25 LAB — POCT HEMOGLOBIN-HEMACUE: Hemoglobin: 11.2 g/dL — ABNORMAL LOW (ref 12.0–15.0)

## 2019-09-07 ENCOUNTER — Other Ambulatory Visit: Payer: Self-pay

## 2019-09-07 ENCOUNTER — Ambulatory Visit (HOSPITAL_COMMUNITY)
Admission: RE | Admit: 2019-09-07 | Discharge: 2019-09-07 | Disposition: A | Discharge status: Home / Self Care | Payer: Medicare Other | Source: Ambulatory Visit | Attending: Nephrology | Admitting: Nephrology

## 2019-09-07 VITALS — BP 150/60 | HR 87 | Temp 97.1°F | Resp 20

## 2019-09-07 DIAGNOSIS — D638 Anemia in other chronic diseases classified elsewhere: Secondary | ICD-10-CM | POA: Insufficient documentation

## 2019-09-07 LAB — POCT HEMOGLOBIN-HEMACUE: Hemoglobin: 11.8 g/dL — ABNORMAL LOW (ref 12.0–15.0)

## 2019-09-07 MED ORDER — EPOETIN ALFA 20000 UNIT/ML IJ SOLN
20000.0000 [IU] | INTRAMUSCULAR | Status: DC
  Administered 2019-09-07: 10:00:00 20000 [IU] via SUBCUTANEOUS

## 2019-09-07 MED ORDER — EPOETIN ALFA 20000 UNIT/ML IJ SOLN
INTRAMUSCULAR | Status: AC
  Administered 2019-09-07: 20000 [IU] via SUBCUTANEOUS
  Filled 2019-09-07: qty 1

## 2019-09-21 ENCOUNTER — Other Ambulatory Visit: Payer: Self-pay

## 2019-09-21 ENCOUNTER — Ambulatory Visit (HOSPITAL_COMMUNITY)
Admission: RE | Admit: 2019-09-21 | Discharge: 2019-09-21 | Disposition: A | Discharge status: Home / Self Care | Payer: Medicare Other | Source: Ambulatory Visit | Attending: Nephrology | Admitting: Nephrology

## 2019-09-21 VITALS — BP 94/80 | HR 86 | Temp 97.3°F | Resp 14 | Ht 61.0 in | Wt 110.0 lb

## 2019-09-21 DIAGNOSIS — D638 Anemia in other chronic diseases classified elsewhere: Secondary | ICD-10-CM | POA: Diagnosis present

## 2019-09-21 LAB — POCT HEMOGLOBIN-HEMACUE: Hemoglobin: 12.7 g/dL (ref 12.0–15.0)

## 2019-09-21 LAB — IRON AND TIBC
Iron: 155 ug/dL (ref 28–170)
Saturation Ratios: 48 % — ABNORMAL HIGH (ref 10.4–31.8)
TIBC: 325 ug/dL (ref 250–450)
UIBC: 170 ug/dL

## 2019-09-21 LAB — FERRITIN: Ferritin: 1860 ng/mL — ABNORMAL HIGH (ref 11–307)

## 2019-09-21 MED ORDER — EPOETIN ALFA 20000 UNIT/ML IJ SOLN: 20000.0000 [IU] | INTRAMUSCULAR | Status: DC

## 2019-10-05 ENCOUNTER — Encounter (HOSPITAL_COMMUNITY): Payer: Medicare Other

## 2019-10-12 ENCOUNTER — Ambulatory Visit (HOSPITAL_COMMUNITY)
Admission: RE | Admit: 2019-10-12 | Discharge: 2019-10-12 | Disposition: A | Discharge status: Home / Self Care | Payer: Medicare Other | Source: Ambulatory Visit | Attending: Nephrology | Admitting: Nephrology

## 2019-10-12 ENCOUNTER — Other Ambulatory Visit: Payer: Self-pay

## 2019-10-12 VITALS — BP 124/66 | HR 94 | Temp 98.6°F | Resp 20

## 2019-10-12 DIAGNOSIS — D638 Anemia in other chronic diseases classified elsewhere: Secondary | ICD-10-CM | POA: Diagnosis not present

## 2019-10-12 LAB — POCT HEMOGLOBIN-HEMACUE: Hemoglobin: 10.8 g/dL — ABNORMAL LOW (ref 12.0–15.0)

## 2019-10-12 MED ORDER — EPOETIN ALFA 20000 UNIT/ML IJ SOLN
INTRAMUSCULAR | Status: AC
  Filled 2019-10-12: qty 1

## 2019-10-12 MED ORDER — EPOETIN ALFA 20000 UNIT/ML IJ SOLN
20000.0000 [IU] | INTRAMUSCULAR | Status: DC
  Administered 2019-10-12: 20000 [IU] via SUBCUTANEOUS

## 2019-10-26 ENCOUNTER — Other Ambulatory Visit: Payer: Self-pay

## 2019-10-26 ENCOUNTER — Ambulatory Visit (HOSPITAL_COMMUNITY)
Admission: RE | Admit: 2019-10-26 | Discharge: 2019-10-26 | Disposition: A | Discharge status: Home / Self Care | Payer: Medicare Other | Source: Ambulatory Visit | Attending: Nephrology | Admitting: Nephrology

## 2019-10-26 VITALS — BP 145/65 | HR 87 | Temp 97.2°F | Resp 20

## 2019-10-26 DIAGNOSIS — D638 Anemia in other chronic diseases classified elsewhere: Secondary | ICD-10-CM | POA: Insufficient documentation

## 2019-10-26 LAB — FERRITIN: Ferritin: 1728 ng/mL — ABNORMAL HIGH (ref 11–307)

## 2019-10-26 LAB — IRON AND TIBC
Iron: 169 ug/dL (ref 28–170)
Saturation Ratios: 48 % — ABNORMAL HIGH (ref 10.4–31.8)
TIBC: 354 ug/dL (ref 250–450)
UIBC: 185 ug/dL

## 2019-10-26 LAB — POCT HEMOGLOBIN-HEMACUE: Hemoglobin: 10.8 g/dL — ABNORMAL LOW (ref 12.0–15.0)

## 2019-10-26 MED ORDER — EPOETIN ALFA 20000 UNIT/ML IJ SOLN: 20000.0000 [IU] | INTRAMUSCULAR | Status: DC

## 2019-10-26 MED ORDER — EPOETIN ALFA 20000 UNIT/ML IJ SOLN
INTRAMUSCULAR | Status: AC
  Administered 2019-10-26: 20000 [IU] via SUBCUTANEOUS
  Filled 2019-10-26: qty 1

## 2019-11-09 ENCOUNTER — Other Ambulatory Visit: Payer: Self-pay

## 2019-11-09 ENCOUNTER — Encounter (HOSPITAL_COMMUNITY)
Admission: RE | Admit: 2019-11-09 | Discharge: 2019-11-09 | Disposition: A | Discharge status: Home / Self Care | Payer: Medicare Other | Source: Ambulatory Visit | Attending: Nephrology | Admitting: Nephrology

## 2019-11-09 VITALS — BP 120/62 | HR 92 | Temp 97.3°F | Resp 20

## 2019-11-09 DIAGNOSIS — D638 Anemia in other chronic diseases classified elsewhere: Secondary | ICD-10-CM | POA: Insufficient documentation

## 2019-11-09 LAB — POCT HEMOGLOBIN-HEMACUE: Hemoglobin: 10.7 g/dL — ABNORMAL LOW (ref 12.0–15.0)

## 2019-11-09 MED ORDER — EPOETIN ALFA 20000 UNIT/ML IJ SOLN
INTRAMUSCULAR | Status: AC
  Filled 2019-11-09: qty 1

## 2019-11-09 MED ORDER — EPOETIN ALFA 20000 UNIT/ML IJ SOLN
20000.0000 [IU] | INTRAMUSCULAR | Status: DC
  Administered 2019-11-09: 20000 [IU] via SUBCUTANEOUS

## 2019-11-23 ENCOUNTER — Ambulatory Visit (HOSPITAL_COMMUNITY)
Admission: RE | Admit: 2019-11-23 | Discharge: 2019-11-23 | Disposition: A | Discharge status: Home / Self Care | Payer: Medicare Other | Source: Ambulatory Visit | Attending: Nephrology | Admitting: Nephrology

## 2019-11-23 ENCOUNTER — Other Ambulatory Visit: Payer: Self-pay

## 2019-11-23 VITALS — BP 117/52 | HR 97 | Temp 97.2°F | Resp 20

## 2019-11-23 DIAGNOSIS — D638 Anemia in other chronic diseases classified elsewhere: Secondary | ICD-10-CM | POA: Insufficient documentation

## 2019-11-23 LAB — IRON AND TIBC
Iron: 176 ug/dL — ABNORMAL HIGH (ref 28–170)
Saturation Ratios: 50 % — ABNORMAL HIGH (ref 10.4–31.8)
TIBC: 349 ug/dL (ref 250–450)
UIBC: 173 ug/dL

## 2019-11-23 LAB — POCT HEMOGLOBIN-HEMACUE: Hemoglobin: 10.6 g/dL — ABNORMAL LOW (ref 12.0–15.0)

## 2019-11-23 LAB — FERRITIN: Ferritin: 1515 ng/mL — ABNORMAL HIGH (ref 11–307)

## 2019-11-23 MED ORDER — EPOETIN ALFA 20000 UNIT/ML IJ SOLN
INTRAMUSCULAR | Status: AC
  Filled 2019-11-23: qty 1

## 2019-11-23 MED ORDER — EPOETIN ALFA 20000 UNIT/ML IJ SOLN
20000.0000 [IU] | INTRAMUSCULAR | Status: DC
  Administered 2019-11-23: 20000 [IU] via SUBCUTANEOUS

## 2019-12-07 ENCOUNTER — Other Ambulatory Visit: Payer: Self-pay

## 2019-12-07 ENCOUNTER — Ambulatory Visit (HOSPITAL_COMMUNITY)
Admission: RE | Admit: 2019-12-07 | Discharge: 2019-12-07 | Disposition: A | Discharge status: Home / Self Care | Payer: Medicare Other | Source: Ambulatory Visit | Attending: Nephrology | Admitting: Nephrology

## 2019-12-07 VITALS — BP 137/80 | HR 90 | Temp 96.9°F | Resp 20

## 2019-12-07 DIAGNOSIS — D638 Anemia in other chronic diseases classified elsewhere: Secondary | ICD-10-CM | POA: Insufficient documentation

## 2019-12-07 LAB — POCT HEMOGLOBIN-HEMACUE: Hemoglobin: 10.6 g/dL — ABNORMAL LOW (ref 12.0–15.0)

## 2019-12-07 MED ORDER — EPOETIN ALFA 20000 UNIT/ML IJ SOLN
INTRAMUSCULAR | Status: AC
  Filled 2019-12-07: qty 1

## 2019-12-07 MED ORDER — EPOETIN ALFA 20000 UNIT/ML IJ SOLN
20000.0000 [IU] | INTRAMUSCULAR | Status: DC
  Administered 2019-12-07: 20000 [IU] via SUBCUTANEOUS

## 2019-12-20 ENCOUNTER — Other Ambulatory Visit: Payer: Self-pay

## 2019-12-20 ENCOUNTER — Inpatient Hospital Stay (HOSPITAL_COMMUNITY)
Admission: EM | Admit: 2019-12-20 | Discharge: 2019-12-27 | DRG: 522 | Disposition: A | Discharge status: Skilled Nursing Facility | Payer: Medicare Other | Attending: Internal Medicine | Admitting: Internal Medicine

## 2019-12-20 ENCOUNTER — Emergency Department (HOSPITAL_COMMUNITY): Payer: Medicare Other

## 2019-12-20 ENCOUNTER — Encounter (HOSPITAL_COMMUNITY): Payer: Self-pay | Admitting: Internal Medicine

## 2019-12-20 DIAGNOSIS — E872 Acidosis: Secondary | ICD-10-CM | POA: Diagnosis present

## 2019-12-20 DIAGNOSIS — I10 Essential (primary) hypertension: Secondary | ICD-10-CM | POA: Diagnosis present

## 2019-12-20 DIAGNOSIS — Z885 Allergy status to narcotic agent status: Secondary | ICD-10-CM | POA: Diagnosis not present

## 2019-12-20 DIAGNOSIS — Z8249 Family history of ischemic heart disease and other diseases of the circulatory system: Secondary | ICD-10-CM | POA: Diagnosis not present

## 2019-12-20 DIAGNOSIS — I129 Hypertensive chronic kidney disease with stage 1 through stage 4 chronic kidney disease, or unspecified chronic kidney disease: Secondary | ICD-10-CM | POA: Diagnosis present

## 2019-12-20 DIAGNOSIS — D696 Thrombocytopenia, unspecified: Secondary | ICD-10-CM | POA: Diagnosis present

## 2019-12-20 DIAGNOSIS — D631 Anemia in chronic kidney disease: Secondary | ICD-10-CM | POA: Diagnosis present

## 2019-12-20 DIAGNOSIS — E039 Hypothyroidism, unspecified: Secondary | ICD-10-CM | POA: Diagnosis present

## 2019-12-20 DIAGNOSIS — Z79891 Long term (current) use of opiate analgesic: Secondary | ICD-10-CM | POA: Diagnosis not present

## 2019-12-20 DIAGNOSIS — Z96642 Presence of left artificial hip joint: Secondary | ICD-10-CM

## 2019-12-20 DIAGNOSIS — D62 Acute posthemorrhagic anemia: Secondary | ICD-10-CM | POA: Diagnosis not present

## 2019-12-20 DIAGNOSIS — S72002A Fracture of unspecified part of neck of left femur, initial encounter for closed fracture: Secondary | ICD-10-CM

## 2019-12-20 DIAGNOSIS — Z79899 Other long term (current) drug therapy: Secondary | ICD-10-CM

## 2019-12-20 DIAGNOSIS — N1832 Chronic kidney disease, stage 3b: Secondary | ICD-10-CM | POA: Diagnosis present

## 2019-12-20 DIAGNOSIS — M84452A Pathological fracture, left femur, initial encounter for fracture: Principal | ICD-10-CM | POA: Diagnosis present

## 2019-12-20 DIAGNOSIS — N179 Acute kidney failure, unspecified: Secondary | ICD-10-CM | POA: Diagnosis present

## 2019-12-20 DIAGNOSIS — M5432 Sciatica, left side: Secondary | ICD-10-CM | POA: Diagnosis present

## 2019-12-20 DIAGNOSIS — R350 Frequency of micturition: Secondary | ICD-10-CM | POA: Diagnosis present

## 2019-12-20 DIAGNOSIS — Z7982 Long term (current) use of aspirin: Secondary | ICD-10-CM | POA: Diagnosis not present

## 2019-12-20 DIAGNOSIS — Z79890 Hormone replacement therapy (postmenopausal): Secondary | ICD-10-CM

## 2019-12-20 DIAGNOSIS — Z20822 Contact with and (suspected) exposure to covid-19: Secondary | ICD-10-CM | POA: Diagnosis present

## 2019-12-20 DIAGNOSIS — X58XXXA Exposure to other specified factors, initial encounter: Secondary | ICD-10-CM | POA: Diagnosis present

## 2019-12-20 DIAGNOSIS — S72009A Fracture of unspecified part of neck of unspecified femur, initial encounter for closed fracture: Secondary | ICD-10-CM

## 2019-12-20 DIAGNOSIS — N183 Chronic kidney disease, stage 3 unspecified: Secondary | ICD-10-CM | POA: Diagnosis present

## 2019-12-20 DIAGNOSIS — E785 Hyperlipidemia, unspecified: Secondary | ICD-10-CM | POA: Diagnosis present

## 2019-12-20 DIAGNOSIS — Z419 Encounter for procedure for purposes other than remedying health state, unspecified: Secondary | ICD-10-CM

## 2019-12-20 HISTORY — DX: Hyperlipidemia, unspecified: E78.5

## 2019-12-20 HISTORY — DX: Essential (primary) hypertension: I10

## 2019-12-20 LAB — URINALYSIS, ROUTINE W REFLEX MICROSCOPIC
Bilirubin Urine: NEGATIVE
Glucose, UA: NEGATIVE mg/dL
Hgb urine dipstick: NEGATIVE
Ketones, ur: 5 mg/dL — AB
Leukocytes,Ua: NEGATIVE
Nitrite: NEGATIVE
Protein, ur: NEGATIVE mg/dL
Specific Gravity, Urine: 1.017 (ref 1.005–1.030)
pH: 6 (ref 5.0–8.0)

## 2019-12-20 LAB — CBC WITH DIFFERENTIAL/PLATELET
Abs Immature Granulocytes: 0.03 10*3/uL (ref 0.00–0.07)
Basophils Absolute: 0 10*3/uL (ref 0.0–0.1)
Basophils Relative: 0 %
Eosinophils Absolute: 0 10*3/uL (ref 0.0–0.5)
Eosinophils Relative: 0 %
HCT: 37.4 % (ref 36.0–46.0)
Hemoglobin: 11.9 g/dL — ABNORMAL LOW (ref 12.0–15.0)
Immature Granulocytes: 0 %
Lymphocytes Relative: 10 %
Lymphs Abs: 0.7 10*3/uL (ref 0.7–4.0)
MCH: 30.7 pg (ref 26.0–34.0)
MCHC: 31.8 g/dL (ref 30.0–36.0)
MCV: 96.6 fL (ref 80.0–100.0)
Monocytes Absolute: 0.3 10*3/uL (ref 0.1–1.0)
Monocytes Relative: 4 %
Neutro Abs: 6 10*3/uL (ref 1.7–7.7)
Neutrophils Relative %: 86 %
Platelets: 121 10*3/uL — ABNORMAL LOW (ref 150–400)
RBC: 3.87 MIL/uL (ref 3.87–5.11)
RDW: 16 % — ABNORMAL HIGH (ref 11.5–15.5)
WBC: 7.1 10*3/uL (ref 4.0–10.5)
nRBC: 0 % (ref 0.0–0.2)

## 2019-12-20 LAB — SURGICAL PCR SCREEN
MRSA, PCR: NEGATIVE
Staphylococcus aureus: POSITIVE — AB

## 2019-12-20 LAB — BASIC METABOLIC PANEL
Anion gap: 16 — ABNORMAL HIGH (ref 5–15)
BUN: 30 mg/dL — ABNORMAL HIGH (ref 8–23)
CO2: 16 mmol/L — ABNORMAL LOW (ref 22–32)
Calcium: 9.3 mg/dL (ref 8.9–10.3)
Chloride: 104 mmol/L (ref 98–111)
Creatinine, Ser: 1.75 mg/dL — ABNORMAL HIGH (ref 0.44–1.00)
GFR calc Af Amer: 30 mL/min — ABNORMAL LOW (ref >60)
GFR calc non Af Amer: 26 mL/min — ABNORMAL LOW (ref >60)
Glucose, Bld: 150 mg/dL — ABNORMAL HIGH (ref 70–99)
Potassium: 4.3 mmol/L (ref 3.5–5.1)
Sodium: 136 mmol/L (ref 135–145)

## 2019-12-20 LAB — SARS CORONAVIRUS 2 (TAT 6-24 HRS): SARS Coronavirus 2: NEGATIVE

## 2019-12-20 MED ORDER — ENSURE PRE-SURGERY PO LIQD
296.0000 mL | Freq: Once | ORAL | Status: AC
  Administered 2019-12-20: 296 mL via ORAL
  Filled 2019-12-20: qty 296

## 2019-12-20 MED ORDER — AMLODIPINE BESYLATE 2.5 MG PO TABS
2.5000 mg | ORAL_TABLET | Freq: Two times a day (BID) | ORAL | Status: DC
  Administered 2019-12-20 – 2019-12-22 (×3): 2.5 mg via ORAL
  Filled 2019-12-20 (×3): qty 1

## 2019-12-20 MED ORDER — ENOXAPARIN SODIUM 30 MG/0.3ML ~~LOC~~ SOLN
30.0000 mg | SUBCUTANEOUS | Status: DC
  Administered 2019-12-20: 30 mg via SUBCUTANEOUS
  Filled 2019-12-20: qty 0.3

## 2019-12-20 MED ORDER — FENOFIBRATE 160 MG PO TABS
160.0000 mg | ORAL_TABLET | Freq: Every day | ORAL | Status: DC
  Administered 2019-12-22 – 2019-12-27 (×6): 160 mg via ORAL
  Filled 2019-12-20 (×7): qty 1

## 2019-12-20 MED ORDER — TRAMADOL HCL 50 MG PO TABS
50.0000 mg | ORAL_TABLET | Freq: Four times a day (QID) | ORAL | Status: DC | PRN
  Administered 2019-12-23 – 2019-12-25 (×3): 50 mg via ORAL
  Filled 2019-12-20 (×3): qty 1

## 2019-12-20 MED ORDER — SODIUM CHLORIDE 0.9 % IV SOLN
INTRAVENOUS | Status: DC
  Administered 2019-12-20: 17:00:00 1000 mL via INTRAVENOUS

## 2019-12-20 MED ORDER — LEVOTHYROXINE SODIUM 25 MCG PO TABS
25.0000 ug | ORAL_TABLET | Freq: Every day | ORAL | Status: DC
  Administered 2019-12-21 – 2019-12-27 (×7): 25 ug via ORAL
  Filled 2019-12-20 (×7): qty 1

## 2019-12-20 MED ORDER — MORPHINE SULFATE (PF) 2 MG/ML IV SOLN
1.0000 mg | INTRAVENOUS | Status: DC | PRN
  Administered 2019-12-20 – 2019-12-27 (×4): 1 mg via INTRAVENOUS
  Filled 2019-12-20 (×4): qty 1

## 2019-12-20 MED ORDER — ATORVASTATIN CALCIUM 40 MG PO TABS
40.0000 mg | ORAL_TABLET | Freq: Every day | ORAL | Status: DC
  Administered 2019-12-22 – 2019-12-27 (×5): 40 mg via ORAL
  Filled 2019-12-20 (×6): qty 1

## 2019-12-20 MED ORDER — HYDROMORPHONE HCL 1 MG/ML IJ SOLN
0.5000 mg | Freq: Once | INTRAMUSCULAR | Status: AC
  Administered 2019-12-20: 0.5 mg via INTRAVENOUS
  Filled 2019-12-20: qty 1

## 2019-12-20 MED ORDER — ACETAMINOPHEN 500 MG PO TABS
1000.0000 mg | ORAL_TABLET | Freq: Two times a day (BID) | ORAL | Status: DC
  Administered 2019-12-20 – 2019-12-27 (×13): 1000 mg via ORAL
  Filled 2019-12-20 (×13): qty 2

## 2019-12-20 NOTE — Consult Note (Signed)
Reason for Consult:Left hip fx Referring Physician: C Milka Villa is an 86 y.o. female.  HPI: Diana Villa comes in today with a 2w hx/o left hip pain. She says it started out of the blue and denies any sort of trauma. He has issues with sciatica on that side and attributed it to that. She was given a round of steroids that didn't seem to help. The pain gradually increased until she couldn't get up today and was brought to the ED. X-rays showed a left hip fx and orthopedic surgery was consulted. She lives alone and ambulates with a combination of cane and walker.  No past medical history on file.   No family history on file.  Social History:  has no history on file for tobacco, alcohol, and drug.  Allergies:  Allergies  Allergen Reactions  . Codeine     Medications: I have reviewed the patient's current medications.  Results for orders placed or performed during the hospital encounter of 12/20/19 (from the past 48 hour(s))  Basic metabolic panel     Status: Abnormal   Collection Time: 12/20/19  3:02 PM  Result Value Ref Range   Sodium 136 135 - 145 mmol/L   Potassium 4.3 3.5 - 5.1 mmol/L   Chloride 104 98 - 111 mmol/L   CO2 16 (L) 22 - 32 mmol/L   Glucose, Bld 150 (H) 70 - 99 mg/dL    Comment: Glucose reference range applies only to samples taken after fasting for at least 8 hours.   BUN 30 (H) 8 - 23 mg/dL   Creatinine, Ser 1.75 (H) 0.44 - 1.00 mg/dL   Calcium 9.3 8.9 - 10.3 mg/dL   GFR calc non Af Amer 26 (L) >60 mL/min   GFR calc Af Amer 30 (L) >60 mL/min   Anion gap 16 (H) 5 - 15    Comment: Performed at [facility] [address] [place], [place] [address]  CBC with Differential     Status: Abnormal   Collection Time: 12/20/19  3:02 PM  Result Value Ref Range   WBC 7.1 4.0 - 10.5 K/uL   RBC 3.87 3.87 - 5.11 MIL/uL   Hemoglobin 11.9 (L) 12.0 - 15.0 g/dL   HCT 37.4 36.0 - 46.0 %   MCV 96.6 80.0 - 100.0 fL   MCH 30.7 26.0 - 34.0 pg   MCHC 31.8 30.0 - 36.0  g/dL   RDW 16.0 (H) 11.5 - 15.5 %   Platelets 121 (L) 150 - 400 K/uL   nRBC 0.0 0.0 - 0.2 %   Neutrophils Relative % 86 %   Neutro Abs 6.0 1.7 - 7.7 K/uL   Lymphocytes Relative 10 %   Lymphs Abs 0.7 0.7 - 4.0 K/uL   Monocytes Relative 4 %   Monocytes Absolute 0.3 0.1 - 1.0 K/uL   Eosinophils Relative 0 %   Eosinophils Absolute 0.0 0.0 - 0.5 K/uL   Basophils Relative 0 %   Basophils Absolute 0.0 0.0 - 0.1 K/uL   Immature Granulocytes 0 %   Abs Immature Granulocytes 0.03 0.00 - 0.07 K/uL    Comment: Performed at [facility] [address] [place], [place] [address]    DG Chest 1 View  Result Date: 12/20/2019 CLINICAL DATA:  Hip fracture EXAM: CHEST  1 VIEW COMPARISON:  None. FINDINGS: Cardiac enlargement without heart failure. Atherosclerotic aortic arch. No acute abnormality chest. No infiltrate effusion or mass. Chronic fracture right medial clavicle IMPRESSION: No  active disease. Electronically Signed   By: Franchot Gallo M.D.   On: 12/20/2019 15:02   DG Knee Complete 4 Views Left  Result Date: 12/20/2019 CLINICAL DATA:  Knee pain EXAM: LEFT KNEE - COMPLETE 4+ VIEW COMPARISON:  None. FINDINGS: Chronic fracture left tibial plateau fixed with 2 transverse screws. Soft tissue calcification in the medial collateral ligament. Medial joint space normal. Marked varus angulation of the knee. Thickening of the proximal fibula likely due to chronic fracture. Negative for acute fracture IMPRESSION: Chronic fractures as above. Degenerative changes most prominent in the lateral joint compartment. No acute fracture. Electronically Signed   By: Franchot Gallo M.D.   On: 12/20/2019 15:00   DG Hip Unilat With Pelvis 2-3 Views Left  Result Date: 12/20/2019 CLINICAL DATA:  Left hip pain 2 weeks EXAM: DG HIP (WITH OR WITHOUT PELVIS) 2-3V LEFT COMPARISON:  09/04/2016 FINDINGS: Left femoral neck fracture with superior displacement of the femur as well as angulation and mild impaction. Left hip joint  normal Normal right hip. No other pelvic lesions IMPRESSION: Left femoral neck fracture. Electronically Signed   By: Franchot Gallo M.D.   On: 12/20/2019 15:01    Review of Systems  HENT: Negative for ear discharge, ear pain, hearing loss and tinnitus.   Eyes: Negative for photophobia and pain.  Respiratory: Negative for cough and shortness of breath.   Cardiovascular: Negative for chest pain.  Gastrointestinal: Negative for abdominal pain, nausea and vomiting.  Genitourinary: Negative for dysuria, flank pain, frequency and urgency.  Musculoskeletal: Positive for arthralgias (Left hip). Negative for back pain, myalgias and neck pain.  Neurological: Negative for dizziness and headaches.  Hematological: Does not bruise/bleed easily.  Psychiatric/Behavioral: The patient is not nervous/anxious.    Blood pressure (!) 148/68, pulse 90, temperature 98.1 F (36.7 Diana), resp. rate 16, height 5' (1.524 m), weight 50 kg, SpO2 97 %. Physical Exam  Constitutional: She appears well-developed and well-nourished. No distress.  HENT:  Head: Normocephalic and atraumatic.  Eyes: Conjunctivae are normal. Right eye exhibits no discharge. Left eye exhibits no discharge. No scleral icterus.  Cardiovascular: Normal rate and regular rhythm.  Respiratory: Effort normal. No respiratory distress.  Musculoskeletal:     Cervical back: Normal range of motion.     Comments: LLE No traumatic wounds, ecchymosis, or rash  Hip mod TTP  No knee or ankle effusion  Knee stable to varus/ valgus and anterior/posterior stress  Sens DPN, SPN, TN intact  Motor EHL, ext, flex, evers 5/5  DP 2+, PT 1+, No significant edema  Neurological: She is alert.  Skin: Skin is warm and dry. She is not diaphoretic.  Psychiatric: She has a normal mood and affect. Her behavior is normal.    Assessment/Plan: Left hip fx -- Will plan on hip hemi tomorrow, surgeon and timing TBD. Please keep NPO after MN. Multiple medical problems including  anemia of chronic disease, hypertension, hyperlipidemia, and thyroid disorder -- per internal medicine who will admit, manage, and clear. Appreciate their help.    [name], PA-[facility] [phone] 12/20/2019, 4:13 PM

## 2019-12-20 NOTE — H&P (View-Only) (Signed)
Reason for Consult:Left hip fx Referring Physician: C Annaleigh Knoche is an 86 y.o. female.  HPI: Diana Villa comes in today with a 2w hx/o left hip pain. She says it started out of the blue and denies any sort of trauma. He has issues with sciatica on that side and attributed it to that. She was given a round of steroids that didn't seem to help. The pain gradually increased until she couldn't get up today and was brought to the ED. X-rays showed a left hip fx and orthopedic surgery was consulted. She lives alone and ambulates with a combination of cane and walker.  No past medical history on file.   No family history on file.  Social History:  has no history on file for tobacco, alcohol, and drug.  Allergies:  Allergies  Allergen Reactions  . Codeine     Medications: I have reviewed the patient's current medications.  Results for orders placed or performed during the hospital encounter of 12/20/19 (from the past 48 hour(s))  Basic metabolic panel     Status: Abnormal   Collection Time: 12/20/19  3:02 PM  Result Value Ref Range   Sodium 136 135 - 145 mmol/L   Potassium 4.3 3.5 - 5.1 mmol/L   Chloride 104 98 - 111 mmol/L   CO2 16 (L) 22 - 32 mmol/L   Glucose, Bld 150 (H) 70 - 99 mg/dL    Comment: Glucose reference range applies only to samples taken after fasting for at least 8 hours.   BUN 30 (H) 8 - 23 mg/dL   Creatinine, Ser 1.75 (H) 0.44 - 1.00 mg/dL   Calcium 9.3 8.9 - 10.3 mg/dL   GFR calc non Af Amer 26 (L) >60 mL/min   GFR calc Af Amer 30 (L) >60 mL/min   Anion gap 16 (H) 5 - 15    Comment: Performed at [facility] [address], [place], [place] [address]  CBC with Differential     Status: Abnormal   Collection Time: 12/20/19  3:02 PM  Result Value Ref Range   WBC 7.1 4.0 - 10.5 K/uL   RBC 3.87 3.87 - 5.11 MIL/uL   Hemoglobin 11.9 (L) 12.0 - 15.0 g/dL   HCT 37.4 36.0 - 46.0 %   MCV 96.6 80.0 - 100.0 fL   MCH 30.7 26.0 - 34.0 pg   MCHC 31.8 30.0 - 36.0  g/dL   RDW 16.0 (H) 11.5 - 15.5 %   Platelets 121 (L) 150 - 400 K/uL   nRBC 0.0 0.0 - 0.2 %   Neutrophils Relative % 86 %   Neutro Abs 6.0 1.7 - 7.7 K/uL   Lymphocytes Relative 10 %   Lymphs Abs 0.7 0.7 - 4.0 K/uL   Monocytes Relative 4 %   Monocytes Absolute 0.3 0.1 - 1.0 K/uL   Eosinophils Relative 0 %   Eosinophils Absolute 0.0 0.0 - 0.5 K/uL   Basophils Relative 0 %   Basophils Absolute 0.0 0.0 - 0.1 K/uL   Immature Granulocytes 0 %   Abs Immature Granulocytes 0.03 0.00 - 0.07 K/uL    Comment: Performed at [facility] [address], [place], [place] [address]    DG Chest 1 View  Result Date: 12/20/2019 CLINICAL DATA:  Hip fracture EXAM: CHEST  1 VIEW COMPARISON:  None. FINDINGS: Cardiac enlargement without heart failure. Atherosclerotic aortic arch. No acute abnormality chest. No infiltrate effusion or mass. Chronic fracture right medial clavicle IMPRESSION: No  active disease. Electronically Signed   By: Franchot Gallo M.D.   On: 12/20/2019 15:02   DG Knee Complete 4 Views Left  Result Date: 12/20/2019 CLINICAL DATA:  Knee pain EXAM: LEFT KNEE - COMPLETE 4+ VIEW COMPARISON:  None. FINDINGS: Chronic fracture left tibial plateau fixed with 2 transverse screws. Soft tissue calcification in the medial collateral ligament. Medial joint space normal. Marked varus angulation of the knee. Thickening of the proximal fibula likely due to chronic fracture. Negative for acute fracture IMPRESSION: Chronic fractures as above. Degenerative changes most prominent in the lateral joint compartment. No acute fracture. Electronically Signed   By: Franchot Gallo M.D.   On: 12/20/2019 15:00   DG Hip Unilat With Pelvis 2-3 Views Left  Result Date: 12/20/2019 CLINICAL DATA:  Left hip pain 2 weeks EXAM: DG HIP (WITH OR WITHOUT PELVIS) 2-3V LEFT COMPARISON:  09/04/2016 FINDINGS: Left femoral neck fracture with superior displacement of the femur as well as angulation and mild impaction. Left hip joint  normal Normal right hip. No other pelvic lesions IMPRESSION: Left femoral neck fracture. Electronically Signed   By: Franchot Gallo M.D.   On: 12/20/2019 15:01    Review of Systems  HENT: Negative for ear discharge, ear pain, hearing loss and tinnitus.   Eyes: Negative for photophobia and pain.  Respiratory: Negative for cough and shortness of breath.   Cardiovascular: Negative for chest pain.  Gastrointestinal: Negative for abdominal pain, nausea and vomiting.  Genitourinary: Negative for dysuria, flank pain, frequency and urgency.  Musculoskeletal: Positive for arthralgias (Left hip). Negative for back pain, myalgias and neck pain.  Neurological: Negative for dizziness and headaches.  Hematological: Does not bruise/bleed easily.  Psychiatric/Behavioral: The patient is not nervous/anxious.    Blood pressure (!) 148/68, pulse 90, temperature 98.1 F (36.7 C), resp. rate 16, height 5' (1.524 m), weight 50 kg, SpO2 97 %. Physical Exam  Constitutional: She appears well-developed and well-nourished. No distress.  HENT:  Head: Normocephalic and atraumatic.  Eyes: Conjunctivae are normal. Right eye exhibits no discharge. Left eye exhibits no discharge. No scleral icterus.  Cardiovascular: Normal rate and regular rhythm.  Respiratory: Effort normal. No respiratory distress.  Musculoskeletal:     Cervical back: Normal range of motion.     Comments: LLE No traumatic wounds, ecchymosis, or rash  Hip mod TTP  No knee or ankle effusion  Knee stable to varus/ valgus and anterior/posterior stress  Sens DPN, SPN, TN intact  Motor EHL, ext, flex, evers 5/5  DP 2+, PT 1+, No significant edema  Neurological: She is alert.  Skin: Skin is warm and dry. She is not diaphoretic.  Psychiatric: She has a normal mood and affect. Her behavior is normal.    Assessment/Plan: Left hip fx -- Will plan on hip hemi tomorrow, surgeon and timing TBD. Please keep NPO after MN. Multiple medical problems including  anemia of chronic disease, hypertension, hyperlipidemia, and thyroid disorder -- per internal medicine who will admit, manage, and clear. Appreciate their help.    [name], PA-[facility] [phone] 12/20/2019, 4:13 PM

## 2019-12-20 NOTE — H&P (Signed)
History and Physical    Diana Villa [id number] DOB: August 02, 1933 DOA: 12/20/2019  PCP: Patient, No Pcp Per   Patient coming from: Home.   I have personally briefly reviewed patient's old medical records in [facility]  Chief Complaint: left hip pain and unable to ambulate due to pain.   HPI: Diana Villa is a 86 y.o. female with medical history significant of Hypertension, Hyperlipidemia, hypothyroidism, Stage 3 CKD, anemia of chronic disease, presents to ED for worsening left hip pain, unable to ambulate. She denies falling or hitting or any kind of trauma to the left hip. She was seen in PCP office and was given pain meds. Pt unable to tolerate the pain presented to ED. She denies any chest pain, sob, nausea, vomiting, abdominal pain, headache, reports occasional dizziness, some tingling of the lower extremities. She reports some frequent urination but no dysuria.  ED Course: on arrival to ED, she underwent lab work showing sodium of 136, potassium of 4.3, BUN OF 30, creatinine of 1.75,  Bicarb of 16, hemoglobin is 11.9, platelets of 121.  X rays of the hips show left femoral neck fracture.  covid 19 screening test is pending.  CXR is negative for active disease.    She was referred to medical service for admission and orthopedics consulted for recommendations.   Review of Systems: As per HPI otherwise  "All others reviewed and are negative,"   Past medical history:  Hypertension.  Hyperlipidemia.  Stage 3 CKD.  Hypothyroidism.   Social History:  Pt denies any smoking or alcohol use  Allergies  Allergen Reactions  . Codeine Other (See Comments)    "Made me feel like I was in another world"    Family history is positive for heart disease.   Prior to Admission medications   Medication Sig Start Date End Date Taking? Authorizing Provider  acetaminophen (TYLENOL) 500 MG tablet Take 1,000 mg by mouth in the morning and at bedtime.    Yes [provider]    amLODipine (NORVASC) 2.5 MG tablet Take 2.5 mg by mouth 2 (two) times daily.   Yes [provider]  aspirin 325 MG EC tablet Take 162.5 mg by mouth daily.    Yes [provider]  atorvastatin (LIPITOR) 40 MG tablet Take 40 mg by mouth daily.   Yes [provider]  Cholecalciferol (VITAMIN D PO) Take 1 tablet by mouth daily.   Yes [provider]  Cyanocobalamin (VITAMIN B-12 PO) Take 1 tablet by mouth daily.   Yes [provider]  fenofibrate 160 MG tablet Take 160 mg by mouth daily.   Yes [provider]  ferrous sulfate 325 (65 FE) MG EC tablet Take 650 mg by mouth daily with breakfast.   Yes [provider]  levothyroxine (SYNTHROID, LEVOTHROID) 25 MCG tablet Take 25 mcg by mouth daily before breakfast.   Yes [provider]  methylPREDNISolone (MEDROL DOSEPAK) 4 MG TBPK tablet Take 4-24 mg by mouth See admin instructions. Take 24 mg (6 tablets) by mouth on day one and DECREASE by 4 mg (1 tablet) daily until finished 12/20/19 12/26/19 Yes [provider]  traMADol (ULTRAM) 50 MG tablet Take 50 mg by mouth every 6 (six) hours as needed for moderate pain.   Yes [provider]    Physical Exam:  Constitutional: inmild distress from left hip pain, wants to know when she can eat.  Vitals:   12/20/19 1645 12/20/19 1700 12/20/19 1715 12/20/19 1739  BP: [place] Kitchen)  164/93 139/69 137/68 (!) 151/85  Pulse: (!) 102 91 91 (!) 102  Resp:    19  Temp:    99.5 F (37.5 C)  TempSrc:    Oral  SpO2: 98% 97% 95% 100%  Weight:      Height:       Eyes: PERRL, lids and conjunctivae normal ENMT: Mucous membranes are dry.  Neck: normal, supple, no masses, no thyromegaly Respiratory: clear to auscultation bilaterally,  Normal respiratory effort. No accessory muscle use.  Cardiovascular: Regular rate and rhythm, no murmurs / rubs / gallops. No extremity edema.  Abdomen: no tenderness, no masses palpated. . Bowel sounds  positive.  Musculoskeletal: no clubbing / cyanosis. Painful ROM of the left hip.  Skin: no rashes, lesions, ulcers. No induration Neurologic: CN 2-12 grossly intact. Sensation intact, unable to check strength in the lower extremities due to pain in the left hip.  Psychiatric: Normal judgment and insight. Alert and oriented x 3. Normal mood.    Labs on Admission: I have personally reviewed following labs and imaging studies  CBC: Recent Labs  Lab 12/20/19 1502  WBC 7.1  NEUTROABS 6.0  HGB 11.9*  HCT 37.4  MCV 96.6  PLT 123XX123*   Basic Metabolic Panel: Recent Labs  Lab 12/20/19 1502  NA 136  K 4.3  CL 104  CO2 16*  GLUCOSE 150*  BUN 30*  CREATININE 1.75*  CALCIUM 9.3   GFR: Estimated Creatinine Clearance: 17.2 mL/min (A) (by C-G formula based on SCr of 1.75 mg/dL (H)). Liver Function Tests: No results for input(s): AST, ALT, ALKPHOS, BILITOT, PROT, ALBUMIN in the last 168 hours. No results for input(s): LIPASE, AMYLASE in the last 168 hours. No results for input(s): AMMONIA in the last 168 hours. Coagulation Profile: No results for input(s): INR, PROTIME in the last 168 hours. Cardiac Enzymes: No results for input(s): CKTOTAL, CKMB, CKMBINDEX, TROPONINI in the last 168 hours. BNP (last 3 results) No results for input(s): PROBNP in the last 8760 hours. HbA1C: No results for input(s): HGBA1C in the last 72 hours. CBG: No results for input(s): GLUCAP in the last 168 hours. Lipid Profile: No results for input(s): CHOL, HDL, LDLCALC, TRIG, CHOLHDL, LDLDIRECT in the last 72 hours. Thyroid Function Tests: No results for input(s): TSH, T4TOTAL, FREET4, T3FREE, THYROIDAB in the last 72 hours. Anemia Panel: No results for input(s): VITAMINB12, FOLATE, FERRITIN, TIBC, IRON, RETICCTPCT in the last 72 hours. Urine analysis: No results found for: COLORURINE, APPEARANCEUR, LABSPEC, [place], GLUCOSEU, HGBUR, BILIRUBINUR, KETONESUR, PROTEINUR, UROBILINOGEN, NITRITE,  LEUKOCYTESUR  Radiological Exams on Admission: DG Chest 1 View  Result Date: 12/20/2019 CLINICAL DATA:  Hip fracture EXAM: CHEST  1 VIEW COMPARISON:  None. FINDINGS: Cardiac enlargement without heart failure. Atherosclerotic aortic arch. No acute abnormality chest. No infiltrate effusion or mass. Chronic fracture right medial clavicle IMPRESSION: No active disease. Electronically Signed   By: Franchot Gallo M.D.   On: 12/20/2019 15:02   DG Knee Complete 4 Views Left  Result Date: 12/20/2019 CLINICAL DATA:  Knee pain EXAM: LEFT KNEE - COMPLETE 4+ VIEW COMPARISON:  None. FINDINGS: Chronic fracture left tibial plateau fixed with 2 transverse screws. Soft tissue calcification in the medial collateral ligament. Medial joint space normal. Marked varus angulation of the knee. Thickening of the proximal fibula likely due to chronic fracture. Negative for acute fracture IMPRESSION: Chronic fractures as above. Degenerative changes most prominent in the lateral joint compartment. No acute fracture. Electronically Signed   By: Franchot Gallo M.D.  On: 12/20/2019 15:00   DG Hip Unilat With Pelvis 2-3 Views Left  Result Date: 12/20/2019 CLINICAL DATA:  Left hip pain 2 weeks EXAM: DG HIP (WITH OR WITHOUT PELVIS) 2-3V LEFT COMPARISON:  09/04/2016 FINDINGS: Left femoral neck fracture with superior displacement of the femur as well as angulation and mild impaction. Left hip joint normal Normal right hip. No other pelvic lesions IMPRESSION: Left femoral neck fracture. Electronically Signed   By: Franchot Gallo M.D.   On: 12/20/2019 15:01    EKG: pending.   Assessment/Plan Active Problems:   Left displaced femoral neck fracture (HCC)   Essential hypertension   CKD (chronic kidney disease), stage III   Hyperlipidemia   Hypothyroidism    Left femoral neck fracture: Non traumatic.  Admitted for further evaluation.  Pt at baseline ambulates using her cane.  Pain control  With IV morphine and tylenol.   Orthopedics consulted for surgical repair. NPO after midnight.  Get 12 lead EKG, . No baseline EKG on file. No Echocardiogram on file.  She does not appear to be fluid overloaded. She is able to walk with a cane up to 1 block.  Will need 12 lead EKG before we can clear for the surgery.     Essential hypertension:  Suboptimally controlled due to left leg pain.  Better after pain control.    Hyperlipidemia:  Resume statin.    Appears to have stage lll b CKD with some mild metabolic acidosis.   Metabolic acidosis Suspect from dehydration.  Gently hydrate and repeat renal parameters in am.    Mild Anemia and Thrombocytopenia:  No obvious signs of bleeding.  Monitor.    Hypothyroidism: Resume synthroid. Get TSH.   Severity of Illness: The appropriate patient status for this patient is INPATIENT. Inpatient status is judged to be reasonable and necessary in order to provide the required intensity of service to ensure the patient's safety. The patient's presenting symptoms, physical exam findings, and initial radiographic and laboratory data in the context of their chronic comorbidities is felt to place them at high risk for further clinical deterioration. Furthermore, it is not anticipated that the patient will be medically stable for discharge from the hospital within 2 midnights of admission.   * I certify that at the point of admission it is my clinical judgment that the patient will require inpatient hospital care spanning beyond 2 midnights from the point of admission due to high intensity of service, high risk for further deterioration and high frequency of surveillance required.*    DVT prophylaxis: Lovenox.  Code Status: full code. Confirmed in ED.  Family Communication:NOne at bedside.  Disposition Plan: pending ortho eval.  Consults called: Orthopedics.  Admission status: Inpatient/ med surg.    Hosie Poisson MD [facility]   If 7PM-7AM, please contact  night-coverage [email or web address] Use universal [facility] password for that web site. If you do not have the password, please call the hospital operator.  12/20/2019, 8:10 PM

## 2019-12-20 NOTE — Plan of Care (Signed)
  Problem: Education: Goal: Knowledge of General Education information will improve Description: Including pain rating scale, medication(s)/side effects and non-pharmacologic comfort measures Outcome: Progressing   Problem: Clinical Measurements: Goal: Respiratory complications will improve Outcome: Progressing Note: On room air   Problem: Nutrition: Goal: Adequate nutrition will be maintained Outcome: Progressing   Problem: Coping: Goal: Level of anxiety will decrease Outcome: Progressing   Problem: Elimination: Goal: Will not experience complications related to urinary retention Outcome: Progressing   Problem: Pain Managment: Goal: General experience of comfort will improve Outcome: Progressing Note: Treated for left hip pain with morphine   Problem: Safety: Goal: Ability to remain free from injury will improve Outcome: Progressing

## 2019-12-20 NOTE — ED Triage Notes (Signed)
From home via EMS. Left hip pain x2 weeks. Seen at PCP, given pain meds, but she ran out. Denies falls. Ambulatory on scene w/ walker.

## 2019-12-20 NOTE — Plan of Care (Signed)

## 2019-12-20 NOTE — ED Provider Notes (Signed)
[place] EMERGENCY DEPARTMENT Provider Note   CSN: [id number] Arrival date & time: 12/20/19  1337     History Chief Complaint  Patient presents with  . Hip Pain    Diana Villa is a 86 y.o. female with history of anemia of chronic disease, hypertension, hyperlipidemia, thyroid disorder presents for evaluation of acute onset, progressively worsening left hip and knee pain for 2 weeks.  Denies any known injury.  Reports pain is constant, sharp, worsens with any movement or attempts to ambulate.  Reports that she has been ambulating with great difficulty with the aid of a cane and a walker.  States that more recently she has been in so much pain that she has not been able to ambulate to the bathroom and has been urinating on herself.  Feels some numbness and tingling to the left foot but states this is not necessarily unusual for her.  Reports that she went to her PCP who did not order x-rays but sent her home with pain medicines.  She is since out of these medicines.  The history is provided by the patient.       No past medical history on file.  Patient Active Problem List   Diagnosis Date Noted  . Left displaced femoral neck fracture ([place]) 12/20/2019  . Anemia of chronic disease 05/15/2016     OB History   No obstetric history on file.     No family history on file.  Social History   Tobacco Use  . Smoking status: Not on file  Substance Use Topics  . Alcohol use: Not on file  . Drug use: Not on file    Home Medications Prior to Admission medications   Medication Sig Start Date End Date Taking? Authorizing Provider  acetaminophen (TYLENOL) 500 MG tablet Take 1,000 mg by mouth in the morning and at bedtime.    Yes [provider]  amLODipine (NORVASC) 2.5 MG tablet Take 2.5 mg by mouth 2 (two) times daily.   Yes [provider]  aspirin 325 MG EC tablet Take 162.5 mg by mouth daily.    Yes [provider]  atorvastatin  (LIPITOR) 40 MG tablet Take 40 mg by mouth daily.   Yes [provider]  Cholecalciferol (VITAMIN D PO) Take 1 tablet by mouth daily.   Yes [provider]  Cyanocobalamin (VITAMIN B-12 PO) Take 1 tablet by mouth daily.   Yes [provider]  fenofibrate 160 MG tablet Take 160 mg by mouth daily.   Yes [provider]  ferrous sulfate 325 (65 FE) MG EC tablet Take 650 mg by mouth daily with breakfast.   Yes [provider]  levothyroxine (SYNTHROID, LEVOTHROID) 25 MCG tablet Take 25 mcg by mouth daily before breakfast.   Yes [provider]  methylPREDNISolone (MEDROL DOSEPAK) 4 MG TBPK tablet Take 4-24 mg by mouth See admin instructions. Take 24 mg (6 tablets) by mouth on day one and DECREASE by 4 mg (1 tablet) daily until finished 12/20/19 12/26/19 Yes [provider]  traMADol (ULTRAM) 50 MG tablet Take 50 mg by mouth every 6 (six) hours as needed for moderate pain.   Yes [provider]    Allergies    Codeine  Review of Systems   Review of Systems  Constitutional: Negative for chills and fever.  Gastrointestinal: Negative for abdominal pain, nausea and vomiting.  Genitourinary: Negative for dysuria, frequency, hematuria and urgency.  Musculoskeletal: Positive for arthralgias.  Neurological:  Positive for weakness and numbness. Negative for syncope.  All other systems reviewed and are negative.   Physical Exam Updated Vital Signs BP 139/69   Pulse 91   Temp 98.1 F (36.7 C)   Resp 16   Ht 5' (1.524 m)   Wt 50 kg   SpO2 97%   BMI 21.53 kg/m   Physical Exam Vitals and nursing note reviewed.  Constitutional:      General: She is not in acute distress.    Appearance: She is well-developed.  HENT:     Head: Normocephalic and atraumatic.  Eyes:     General:        Right eye: No discharge.        Left eye: No discharge.     Conjunctiva/sclera: Conjunctivae normal.  Neck:     Vascular: No JVD.     Trachea:  No tracheal deviation.  Cardiovascular:     Rate and Rhythm: Normal rate.     Pulses: Normal pulses.     Comments: 2+ radial and DP/PT pulses bilaterally.  Bevelyn Buckles' sign absent bilaterally, compartments are soft. Pulmonary:     Effort: Pulmonary effort is normal.  Abdominal:     General: There is no distension.  Musculoskeletal:     Comments: No midline lumbar spine tenderness.  Tenderness to palpation to the lateral and anterior aspect of the left hip and lateral aspect of the left knee.  Physical examination is somewhat limited due to pain.  The left lower extremity is shortened and externally rotated.  4/5 strength with plantarflexion and dorsiflexion of the left foot.  Otherwise 5/5 strength of BUE and right lower extremity major muscle groups  Skin:    General: Skin is warm and dry.     Findings: No erythema.  Neurological:     Mental Status: She is alert.     Comments: Subjectively altered sensation to light touch of the left lower extremity as compared to the right.  Psychiatric:        Behavior: Behavior normal.     ED Results / Procedures / Treatments   Labs (all labs ordered are listed, but only abnormal results are displayed) Labs Reviewed  BASIC METABOLIC PANEL - Abnormal; Notable for the following components:      Result Value   CO2 16 (*)    Glucose, Bld 150 (*)    BUN 30 (*)    Creatinine, Ser 1.75 (*)    GFR calc non Af Amer 26 (*)    GFR calc Af Amer 30 (*)    Anion gap 16 (*)    All other components within normal limits  CBC WITH DIFFERENTIAL/PLATELET - Abnormal; Notable for the following components:   Hemoglobin 11.9 (*)    RDW 16.0 (*)    Platelets 121 (*)    All other components within normal limits  SARS CORONAVIRUS 2 (TAT 6-24 HRS)    EKG None  Radiology DG Chest 1 View  Result Date: 12/20/2019 CLINICAL DATA:  Hip fracture EXAM: CHEST  1 VIEW COMPARISON:  None. FINDINGS: Cardiac enlargement without heart failure. Atherosclerotic aortic arch. No  acute abnormality chest. No infiltrate effusion or mass. Chronic fracture right medial clavicle IMPRESSION: No active disease. Electronically Signed   By: Franchot Gallo M.D.   On: 12/20/2019 15:02   DG Knee Complete 4 Views Left  Result Date: 12/20/2019 CLINICAL DATA:  Knee pain EXAM: LEFT KNEE - COMPLETE 4+ VIEW COMPARISON:  None. FINDINGS: Chronic fracture left tibial  plateau fixed with 2 transverse screws. Soft tissue calcification in the medial collateral ligament. Medial joint space normal. Marked varus angulation of the knee. Thickening of the proximal fibula likely due to chronic fracture. Negative for acute fracture IMPRESSION: Chronic fractures as above. Degenerative changes most prominent in the lateral joint compartment. No acute fracture. Electronically Signed   By: Franchot Gallo M.D.   On: 12/20/2019 15:00   DG Hip Unilat With Pelvis 2-3 Views Left  Result Date: 12/20/2019 CLINICAL DATA:  Left hip pain 2 weeks EXAM: DG HIP (WITH OR WITHOUT PELVIS) 2-3V LEFT COMPARISON:  09/04/2016 FINDINGS: Left femoral neck fracture with superior displacement of the femur as well as angulation and mild impaction. Left hip joint normal Normal right hip. No other pelvic lesions IMPRESSION: Left femoral neck fracture. Electronically Signed   By: Franchot Gallo M.D.   On: 12/20/2019 15:01    Procedures Procedures (including critical care time)  Medications Ordered in ED Medications  0.9 %  sodium chloride infusion (1,000 mLs Intravenous New Bag/Given 12/20/19 1630)  morphine 2 MG/ML injection 1-2 mg (has no administration in time range)  HYDROmorphone (DILAUDID) injection 0.5 mg (0.5 mg Intravenous Given 12/20/19 1507)    ED Course  I have reviewed the triage vital signs and the nursing notes.  Pertinent labs & imaging results that were available during my care of the patient were reviewed by me and considered in my medical decision making (see chart for details).    MDM Rules/Calculators/A&P                       Patient presenting for evaluation of 2 weeks of left hip pain and left knee pain.  Reportedly atraumatic.  Patient is afebrile, intermittently hypertensive in the ED.  She is nontoxic in appearance.  She is neurovascularly intact.  On examination the left lower extremity is shortened and externally rotated.  Limited range of motion at the left hip and knee.  No midline spine tenderness.  No signs of serious head injury.  Chest and abdomen are atraumatic on examination.  Imaging significant for acute left femoral neck fracture.  Lab work reviewed and interpreted by me shows no leukocytosis, mild anemia elevated BUN and creatinine the renal insufficiency is at patient's baseline.   CONSULT: Spoke with Silvestre Gunner with orthopedics, plan for operative repair in the morning.  Recommends admission to hospitalist service.  Spoke with Dr. Karleen Hampshire with [facility] hospitalist service who agrees to assume care of patient and bring her into the hospital for further evaluation and management.  Final Clinical Impression(s) / ED Diagnoses Final diagnoses:  Closed fracture of neck of left femur, initial encounter [facility])    Rx / [place] Orders ED Discharge Orders    None       Debroah Baller 12/20/19 1712    Truddie Hidden, MD 12/21/19 1009

## 2019-12-21 ENCOUNTER — Inpatient Hospital Stay (HOSPITAL_COMMUNITY): Payer: Medicare Other

## 2019-12-21 ENCOUNTER — Encounter (HOSPITAL_COMMUNITY)
Admission: EM | Disposition: A | Discharge status: Skilled Nursing Facility | Payer: Self-pay | Source: Home / Self Care | Attending: Internal Medicine

## 2019-12-21 ENCOUNTER — Inpatient Hospital Stay (HOSPITAL_COMMUNITY): Payer: Medicare Other | Admitting: Certified Registered Nurse Anesthetist

## 2019-12-21 ENCOUNTER — Encounter (HOSPITAL_COMMUNITY): Payer: Self-pay | Admitting: Internal Medicine

## 2019-12-21 ENCOUNTER — Encounter (HOSPITAL_COMMUNITY): Payer: Medicare Other

## 2019-12-21 ENCOUNTER — Other Ambulatory Visit: Payer: Self-pay

## 2019-12-21 HISTORY — PX: TOTAL HIP ARTHROPLASTY: SHX124

## 2019-12-21 LAB — CBC
HCT: 30.4 % — ABNORMAL LOW (ref 36.0–46.0)
Hemoglobin: 9.7 g/dL — ABNORMAL LOW (ref 12.0–15.0)
MCH: 30.6 pg (ref 26.0–34.0)
MCHC: 31.9 g/dL (ref 30.0–36.0)
MCV: 95.9 fL (ref 80.0–100.0)
Platelets: 122 10*3/uL — ABNORMAL LOW (ref 150–400)
RBC: 3.17 MIL/uL — ABNORMAL LOW (ref 3.87–5.11)
RDW: 16.1 % — ABNORMAL HIGH (ref 11.5–15.5)
WBC: 6.8 10*3/uL (ref 4.0–10.5)
nRBC: 0 % (ref 0.0–0.2)

## 2019-12-21 LAB — COMPREHENSIVE METABOLIC PANEL
ALT: 26 U/L (ref 0–44)
AST: 39 U/L (ref 15–41)
Albumin: 3.1 g/dL — ABNORMAL LOW (ref 3.5–5.0)
Alkaline Phosphatase: 66 U/L (ref 38–126)
Anion gap: 11 (ref 5–15)
BUN: 28 mg/dL — ABNORMAL HIGH (ref 8–23)
CO2: 19 mmol/L — ABNORMAL LOW (ref 22–32)
Calcium: 8.1 mg/dL — ABNORMAL LOW (ref 8.9–10.3)
Chloride: 107 mmol/L (ref 98–111)
Creatinine, Ser: 1.72 mg/dL — ABNORMAL HIGH (ref 0.44–1.00)
GFR calc Af Amer: 31 mL/min — ABNORMAL LOW (ref >60)
GFR calc non Af Amer: 27 mL/min — ABNORMAL LOW (ref >60)
Glucose, Bld: 123 mg/dL — ABNORMAL HIGH (ref 70–99)
Potassium: 3.8 mmol/L (ref 3.5–5.1)
Sodium: 137 mmol/L (ref 135–145)
Total Bilirubin: 0.7 mg/dL (ref 0.3–1.2)
Total Protein: 5.2 g/dL — ABNORMAL LOW (ref 6.5–8.1)

## 2019-12-21 LAB — PROTIME-INR
INR: 1.2 (ref 0.8–1.2)
Prothrombin Time: 15 seconds (ref 11.4–15.2)

## 2019-12-21 LAB — TSH: TSH: 4.647 u[IU]/mL — ABNORMAL HIGH (ref 0.350–4.500)

## 2019-12-21 SURGERY — ARTHROPLASTY, HIP, TOTAL, ANTERIOR APPROACH
Anesthesia: General | Site: Hip | Laterality: Left

## 2019-12-21 MED ORDER — GLYCOPYRROLATE PF 0.2 MG/ML IJ SOSY
PREFILLED_SYRINGE | INTRAMUSCULAR | Status: DC | PRN
  Administered 2019-12-21: .4 mg via INTRAVENOUS

## 2019-12-21 MED ORDER — EPHEDRINE 5 MG/ML INJ
INTRAVENOUS | Status: AC
  Filled 2019-12-21: qty 10

## 2019-12-21 MED ORDER — NON FORMULARY
Status: DC | PRN
  Administered 2019-12-21: 450 mL

## 2019-12-21 MED ORDER — EPINEPHRINE PF 1 MG/ML IJ SOLN
INTRAMUSCULAR | Status: AC
  Filled 2019-12-21: qty 1

## 2019-12-21 MED ORDER — KETOROLAC TROMETHAMINE 30 MG/ML IJ SOLN
INTRAMUSCULAR | Status: DC | PRN
  Administered 2019-12-21: 30 mg via INTRA_ARTICULAR

## 2019-12-21 MED ORDER — PROPOFOL 10 MG/ML IV BOLUS
INTRAVENOUS | Status: DC | PRN
  Administered 2019-12-21: 120 mg via INTRAVENOUS

## 2019-12-21 MED ORDER — SODIUM CHLORIDE (PF) 0.9 % IJ SOLN
INTRAMUSCULAR | Status: DC | PRN
  Administered 2019-12-21: 30 mL

## 2019-12-21 MED ORDER — STERILE WATER FOR IRRIGATION IR SOLN
Status: DC | PRN
  Administered 2019-12-21: 1000 mL

## 2019-12-21 MED ORDER — DOCUSATE SODIUM 100 MG PO CAPS
100.0000 mg | ORAL_CAPSULE | Freq: Two times a day (BID) | ORAL | Status: DC
  Administered 2019-12-21 – 2019-12-27 (×12): 100 mg via ORAL
  Filled 2019-12-21 (×12): qty 1

## 2019-12-21 MED ORDER — PHENYLEPHRINE 40 MCG/ML (10ML) SYRINGE FOR IV PUSH (FOR BLOOD PRESSURE SUPPORT)
PREFILLED_SYRINGE | INTRAVENOUS | Status: AC
  Filled 2019-12-21: qty 20

## 2019-12-21 MED ORDER — BUPIVACAINE HCL (PF) 0.5 % IJ SOLN
INTRAMUSCULAR | Status: AC
  Filled 2019-12-21: qty 30

## 2019-12-21 MED ORDER — ONDANSETRON HCL 4 MG PO TABS: 4.0000 mg | ORAL_TABLET | Freq: Four times a day (QID) | ORAL | Status: DC | PRN

## 2019-12-21 MED ORDER — MUPIROCIN 2 % EX OINT
1.0000 "application " | TOPICAL_OINTMENT | Freq: Two times a day (BID) | CUTANEOUS | Status: AC
  Administered 2019-12-21 – 2019-12-25 (×10): 1 via TOPICAL
  Filled 2019-12-21 (×3): qty 22

## 2019-12-21 MED ORDER — 0.9 % SODIUM CHLORIDE (POUR BTL) OPTIME
TOPICAL | Status: DC | PRN
  Administered 2019-12-21: 1000 mL

## 2019-12-21 MED ORDER — CHLORHEXIDINE GLUCONATE 4 % EX LIQD
60.0000 mL | Freq: Once | CUTANEOUS | Status: AC
  Administered 2019-12-21: 4 via TOPICAL
  Filled 2019-12-21: qty 60

## 2019-12-21 MED ORDER — KETOROLAC TROMETHAMINE 30 MG/ML IJ SOLN
INTRAMUSCULAR | Status: AC
  Filled 2019-12-21: qty 1

## 2019-12-21 MED ORDER — PHENYLEPHRINE 40 MCG/ML (10ML) SYRINGE FOR IV PUSH (FOR BLOOD PRESSURE SUPPORT)
PREFILLED_SYRINGE | INTRAVENOUS | Status: DC | PRN
  Administered 2019-12-21 (×2): 120 ug via INTRAVENOUS

## 2019-12-21 MED ORDER — PHENYLEPHRINE HCL-NACL 10-0.9 MG/250ML-% IV SOLN
INTRAVENOUS | Status: DC | PRN
  Administered 2019-12-21: 20 ug/min via INTRAVENOUS

## 2019-12-21 MED ORDER — ROCURONIUM BROMIDE 10 MG/ML (PF) SYRINGE
PREFILLED_SYRINGE | INTRAVENOUS | Status: DC | PRN
  Administered 2019-12-21: 50 mg via INTRAVENOUS

## 2019-12-21 MED ORDER — SUCCINYLCHOLINE CHLORIDE 200 MG/10ML IV SOSY
PREFILLED_SYRINGE | INTRAVENOUS | Status: DC | PRN
  Administered 2019-12-21: 100 mg via INTRAVENOUS

## 2019-12-21 MED ORDER — MENTHOL 3 MG MT LOZG: 1.0000 | LOZENGE | OROMUCOSAL | Status: DC | PRN

## 2019-12-21 MED ORDER — LIDOCAINE 2% (20 MG/ML) 5 ML SYRINGE
INTRAMUSCULAR | Status: DC | PRN
  Administered 2019-12-21: 80 mg via INTRAVENOUS

## 2019-12-21 MED ORDER — BUPIVACAINE-EPINEPHRINE (PF) 0.5% -1:200000 IJ SOLN
INTRAMUSCULAR | Status: DC | PRN
  Administered 2019-12-21: 30 mL via PERINEURAL

## 2019-12-21 MED ORDER — SODIUM CHLORIDE 0.9 % IR SOLN
Status: DC | PRN
  Administered 2019-12-21: 1000 mL
  Administered 2019-12-21: 3000 mL

## 2019-12-21 MED ORDER — PROPOFOL 1000 MG/100ML IV EMUL
INTRAVENOUS | Status: AC
  Filled 2019-12-21: qty 100

## 2019-12-21 MED ORDER — ONDANSETRON HCL 4 MG/2ML IJ SOLN
INTRAMUSCULAR | Status: DC | PRN
  Administered 2019-12-21: 4 mg via INTRAVENOUS

## 2019-12-21 MED ORDER — ENOXAPARIN SODIUM 30 MG/0.3ML ~~LOC~~ SOLN: 30.0000 mg | SUBCUTANEOUS | Status: DC

## 2019-12-21 MED ORDER — PHENOL 1.4 % MT LIQD: 1.0000 | OROMUCOSAL | Status: DC | PRN

## 2019-12-21 MED ORDER — FENTANYL CITRATE (PF) 250 MCG/5ML IJ SOLN
INTRAMUSCULAR | Status: AC
  Filled 2019-12-21: qty 5

## 2019-12-21 MED ORDER — EPHEDRINE SULFATE-NACL 50-0.9 MG/10ML-% IV SOSY
PREFILLED_SYRINGE | INTRAVENOUS | Status: DC | PRN
  Administered 2019-12-21: 5 mg via INTRAVENOUS

## 2019-12-21 MED ORDER — SUGAMMADEX SODIUM 200 MG/2ML IV SOLN
INTRAVENOUS | Status: DC | PRN
  Administered 2019-12-21: 200 mg via INTRAVENOUS

## 2019-12-21 MED ORDER — METOCLOPRAMIDE HCL 5 MG PO TABS: 5.0000 mg | ORAL_TABLET | Freq: Three times a day (TID) | ORAL | Status: DC | PRN

## 2019-12-21 MED ORDER — SUCCINYLCHOLINE CHLORIDE 200 MG/10ML IV SOSY
PREFILLED_SYRINGE | INTRAVENOUS | Status: AC
  Filled 2019-12-21: qty 20

## 2019-12-21 MED ORDER — PHENYLEPHRINE HCL (PRESSORS) 10 MG/ML IV SOLN
INTRAVENOUS | Status: AC
  Filled 2019-12-21: qty 1

## 2019-12-21 MED ORDER — ONDANSETRON HCL 4 MG/2ML IJ SOLN
INTRAMUSCULAR | Status: AC
  Filled 2019-12-21: qty 8

## 2019-12-21 MED ORDER — FENTANYL CITRATE (PF) 250 MCG/5ML IJ SOLN
INTRAMUSCULAR | Status: DC | PRN
  Administered 2019-12-21: 100 ug via INTRAVENOUS
  Administered 2019-12-21: 50 ug via INTRAVENOUS

## 2019-12-21 MED ORDER — ALBUMIN HUMAN 5 % IV SOLN: INTRAVENOUS | Status: DC | PRN

## 2019-12-21 MED ORDER — METOCLOPRAMIDE HCL 5 MG/ML IJ SOLN: 5.0000 mg | Freq: Three times a day (TID) | INTRAMUSCULAR | Status: DC | PRN

## 2019-12-21 MED ORDER — ONDANSETRON HCL 4 MG/2ML IJ SOLN: 4.0000 mg | Freq: Four times a day (QID) | INTRAMUSCULAR | Status: DC | PRN

## 2019-12-21 MED ORDER — ROCURONIUM BROMIDE 10 MG/ML (PF) SYRINGE
PREFILLED_SYRINGE | INTRAVENOUS | Status: AC
  Filled 2019-12-21: qty 20

## 2019-12-21 MED ORDER — DEXAMETHASONE SODIUM PHOSPHATE 10 MG/ML IJ SOLN
INTRAMUSCULAR | Status: AC
  Filled 2019-12-21: qty 1

## 2019-12-21 MED ORDER — CEFAZOLIN SODIUM-DEXTROSE 2-4 GM/100ML-% IV SOLN
2.0000 g | Freq: Four times a day (QID) | INTRAVENOUS | Status: AC
  Administered 2019-12-21 – 2019-12-22 (×2): 2 g via INTRAVENOUS
  Filled 2019-12-21 (×2): qty 100

## 2019-12-21 MED ORDER — DEXMEDETOMIDINE HCL 200 MCG/2ML IV SOLN
INTRAVENOUS | Status: DC | PRN
  Administered 2019-12-21: 8 ug via INTRAVENOUS

## 2019-12-21 MED ORDER — FENTANYL CITRATE (PF) 100 MCG/2ML IJ SOLN: 25.0000 ug | INTRAMUSCULAR | Status: DC | PRN

## 2019-12-21 MED ORDER — POVIDONE-IODINE 10 % EX SWAB: 2.0000 "application " | Freq: Once | CUTANEOUS | Status: DC

## 2019-12-21 MED ORDER — TRANEXAMIC ACID-NACL 1000-0.7 MG/100ML-% IV SOLN
INTRAVENOUS | Status: DC | PRN
  Administered 2019-12-21: 1000 mg via INTRAVENOUS

## 2019-12-21 MED ORDER — CEFAZOLIN SODIUM-DEXTROSE 2-4 GM/100ML-% IV SOLN
2.0000 g | INTRAVENOUS | Status: AC
  Administered 2019-12-21: 2 g via INTRAVENOUS
  Filled 2019-12-21: qty 100

## 2019-12-21 MED ORDER — LACTATED RINGERS IV SOLN: INTRAVENOUS | Status: DC

## 2019-12-21 MED ORDER — TRANEXAMIC ACID-NACL 1000-0.7 MG/100ML-% IV SOLN
INTRAVENOUS | Status: AC
  Filled 2019-12-21: qty 100

## 2019-12-21 MED ORDER — LIDOCAINE 2% (20 MG/ML) 5 ML SYRINGE
INTRAMUSCULAR | Status: AC
  Filled 2019-12-21: qty 10

## 2019-12-21 SURGICAL SUPPLY — 70 items
ADH SKN CLS APL DERMABOND .7 (GAUZE/BANDAGES/DRESSINGS) ×1
ALCOHOL 70% 16 OZ (MISCELLANEOUS) ×2 IMPLANT
APL PRP STRL LF DISP 70% ISPRP (MISCELLANEOUS) ×1
BLADE CLIPPER SURG (BLADE) IMPLANT
CHLORAPREP W/TINT 26 (MISCELLANEOUS) ×2 IMPLANT
CNTNR URN SCR LID CUP LEK RST (MISCELLANEOUS) IMPLANT
CONT SPEC 4OZ STRL OR WHT (MISCELLANEOUS) ×2
COVER SURGICAL LIGHT HANDLE (MISCELLANEOUS) ×2 IMPLANT
COVER WAND RF STERILE (DRAPES) ×1 IMPLANT
CUP ACET PINNACLE SECTR 48MM (Joint) IMPLANT
DERMABOND ADVANCED (GAUZE/BANDAGES/DRESSINGS) ×1
DERMABOND ADVANCED .7 DNX12 (GAUZE/BANDAGES/DRESSINGS) ×2 IMPLANT
DRAPE C-ARM 42X72 X-RAY (DRAPES) ×2 IMPLANT
DRAPE STERI IOBAN 125X83 (DRAPES) ×2 IMPLANT
DRAPE U-SHAPE 47X51 STRL (DRAPES) ×6 IMPLANT
DRSG AQUACEL AG ADV 3.5X10 (GAUZE/BANDAGES/DRESSINGS) ×2 IMPLANT
DRSG TELFA 3X8 NADH (GAUZE/BANDAGES/DRESSINGS) ×2 IMPLANT
ELECT BLADE 4.0 EZ CLEAN MEGAD (MISCELLANEOUS) ×2
ELECT PENCIL ROCKER SW 15FT (MISCELLANEOUS) ×2 IMPLANT
ELECT REM PT RETURN 9FT ADLT (ELECTROSURGICAL) ×2
ELECTRODE BLDE 4.0 EZ CLN MEGD (MISCELLANEOUS) ×1 IMPLANT
ELECTRODE REM PT RTRN 9FT ADLT (ELECTROSURGICAL) ×1 IMPLANT
EVACUATOR 1/8 PVC DRAIN (DRAIN) IMPLANT
GLOVE BIO SURGEON STRL SZ8.5 (GLOVE) ×4 IMPLANT
GLOVE BIOGEL PI IND STRL 8.5 (GLOVE) ×1 IMPLANT
GLOVE BIOGEL PI INDICATOR 8.5 (GLOVE) ×1
GLOVE SURG SS PI 7.5 STRL IVOR (GLOVE) ×1 IMPLANT
GOWN STRL REUS W/ TWL LRG LVL3 (GOWN DISPOSABLE) ×2 IMPLANT
GOWN STRL REUS W/TWL 2XL LVL3 (GOWN DISPOSABLE) ×3 IMPLANT
GOWN STRL REUS W/TWL LRG LVL3 (GOWN DISPOSABLE) ×4
HANDPIECE INTERPULSE COAX TIP (DISPOSABLE) ×2
HEAD FEM STD 32X+1 STRL (Hips) ×1 IMPLANT
HOOD PEEL AWAY FACE SHEILD DIS (HOOD) ×4 IMPLANT
HOOD PEEL AWAY FLYTE STAYCOOL (MISCELLANEOUS) ×1 IMPLANT
JET LAVAGE IRRISEPT WOUND (IRRIGATION / IRRIGATOR) ×2
KIT BASIN OR (CUSTOM PROCEDURE TRAY) ×2 IMPLANT
KIT TURNOVER KIT B (KITS) ×2 IMPLANT
LAVAGE JET IRRISEPT WOUND (IRRIGATION / IRRIGATOR) ×1 IMPLANT
LINER ACET 32X48 (Liner) ×1 IMPLANT
MANIFOLD NEPTUNE II (INSTRUMENTS) ×2 IMPLANT
MARKER SKIN DUAL TIP RULER LAB (MISCELLANEOUS) ×4 IMPLANT
NDL SPNL 18GX3.5 QUINCKE PK (NEEDLE) ×1 IMPLANT
NEEDLE SPNL 18GX3.5 QUINCKE PK (NEEDLE) ×2 IMPLANT
NS IRRIG 1000ML POUR BTL (IV SOLUTION) ×2 IMPLANT
PACK TOTAL JOINT (CUSTOM PROCEDURE TRAY) ×2 IMPLANT
PACK UNIVERSAL I (CUSTOM PROCEDURE TRAY) ×2 IMPLANT
PAD ARMBOARD 7.5X6 YLW CONV (MISCELLANEOUS) ×4 IMPLANT
PAD DRESSING TELFA 3X8 NADH (GAUZE/BANDAGES/DRESSINGS) IMPLANT
PINNSECTOR W/GRIP ACE CUP 48MM (Joint) ×2 IMPLANT
SAW OSC TIP CART 19.5X105X1.3 (SAW) ×2 IMPLANT
SEALER BIPOLAR AQUA 6.0 (INSTRUMENTS) ×1 IMPLANT
SET HNDPC FAN SPRY TIP SCT (DISPOSABLE) ×1 IMPLANT
SOL PREP POV-IOD 4OZ 10% (MISCELLANEOUS) ×2 IMPLANT
STEM TRI LOC GRIPTION SZ 3 STD IMPLANT
SUT ETHIBOND NAB CT1 #1 30IN (SUTURE) ×4 IMPLANT
SUT MNCRL AB 3-0 PS2 18 (SUTURE) ×2 IMPLANT
SUT MON AB 2-0 CT1 36 (SUTURE) ×2 IMPLANT
SUT VIC AB 1 CT1 27 (SUTURE) ×2
SUT VIC AB 1 CT1 27XBRD ANBCTR (SUTURE) ×1 IMPLANT
SUT VIC AB 2-0 CT1 27 (SUTURE) ×2
SUT VIC AB 2-0 CT1 TAPERPNT 27 (SUTURE) ×1 IMPLANT
SUT VLOC 180 0 24IN GS25 (SUTURE) ×2 IMPLANT
SYR 50ML LL SCALE MARK (SYRINGE) ×2 IMPLANT
TOWEL GREEN STERILE (TOWEL DISPOSABLE) ×2 IMPLANT
TOWEL GREEN STERILE FF (TOWEL DISPOSABLE) ×2 IMPLANT
TRAY CATH 16FR W/PLASTIC CATH (SET/KITS/TRAYS/PACK) IMPLANT
TRAY FOLEY W/BAG SLVR 16FR (SET/KITS/TRAYS/PACK)
TRAY FOLEY W/BAG SLVR 16FR ST (SET/KITS/TRAYS/PACK) IMPLANT
TRI LOC GRIPTION SZ 3 STD ×2 IMPLANT
WATER STERILE IRR 1000ML POUR (IV SOLUTION) ×6 IMPLANT

## 2019-12-21 NOTE — Transfer of Care (Signed)
Immediate Anesthesia Transfer of Care Note  Patient: Diana Villa  Procedure(s) Performed: TOTAL HIP ARTHROPLASTY ANTERIOR APPROACH (Left Hip)  Patient Location: PACU  Anesthesia Type:General  Level of Consciousness: awake and alert   Airway & Oxygen Therapy: Patient Spontanous Breathing and Patient connected to nasal cannula oxygen  Post-op Assessment: Report given to RN and Post -op Vital signs reviewed and stable  Post vital signs: Reviewed and stable  Last Vitals:  Vitals Value Taken Time  BP 120/49 12/21/19 1555  Temp 36.4 C 12/21/19 1555  Pulse 86 12/21/19 1603  Resp 19 12/21/19 1603  SpO2 100 % 12/21/19 1603  Vitals shown include unvalidated device data.  Last Pain:  Vitals:   12/21/19 1555  TempSrc:   PainSc: 0-No pain      Patients Stated Pain Goal: 3 ([id number])  Complications: No apparent anesthesia complications

## 2019-12-21 NOTE — Anesthesia Preprocedure Evaluation (Addendum)
Anesthesia Evaluation  Patient identified by MRN, date of birth, ID band Patient awake    Reviewed: Allergy & Precautions, NPO status , Patient's Chart, lab work & pertinent test results  Airway Mallampati: II  TM Distance: >3 FB     Dental   Pulmonary    breath sounds clear to auscultation       Cardiovascular hypertension,  Rhythm:Regular Rate:Normal     Neuro/Psych    GI/Hepatic negative GI ROS, Neg liver ROS,   Endo/Other  Hypothyroidism   Renal/GU Renal disease     Musculoskeletal   Abdominal   Peds  Hematology   Anesthesia Other Findings   Reproductive/Obstetrics                            Anesthesia Physical Anesthesia Plan  ASA: III  Anesthesia Plan: General   Post-op Pain Management:    Induction: Intravenous  PONV Risk Score and Plan: 3 and Ondansetron and Midazolam  Airway Management Planned: Oral ETT  Additional Equipment:   Intra-op Plan:   Post-operative Plan: Extubation in OR  Informed Consent: I have reviewed the patients History and Physical, chart, labs and discussed the procedure including the risks, benefits and alternatives for the proposed anesthesia with the patient or authorized representative who has indicated his/her understanding and acceptance.     Dental advisory given  Plan Discussed with: CRNA and Anesthesiologist  Anesthesia Plan Comments:         Anesthesia Quick Evaluation

## 2019-12-21 NOTE — Progress Notes (Signed)
PROGRESS NOTE    Diana Villa  [id number] DOB: 03-03-1933 DOA: 12/20/2019 PCP: Patient, No Pcp Per    Brief Narrative:  86 year old female with history of hypertension, hyperlipidemia, hypothyroidism, stage III chronic kidney disease, anemia of chronic disease presented to the emergency room with about 2 weeks of pain and difficulty ambulating.  She was treated outpatient with pain medications with no relief.  No particular history of trauma.  In the emergency room, fairly stable.  X-ray of the hip showed left femoral neck fracture.  COVID-19 negative.  Chest x-ray negative for acute disease.  Admitted for surgical intervention.   Assessment & Plan:   Active Problems:   Left displaced femoral neck fracture (HCC)   Essential hypertension   CKD (chronic kidney disease), stage III   Hyperlipidemia   Hypothyroidism  Left femoral neck fracture, closed, spontaneous with trivial trauma: Continue pain relief.  Patient is scheduled for left hip hemiarthroplasty today.  Will monitor closely.  Will need PT OT and probably inpatient rehab after procedure.  Essential hypertension: Blood pressures are stable.  Hyperlipidemia: On a statin.  Chronic kidney disease stage IIIb with mild metabolic acidosis: Stable.  Treated with IV fluid hydration.  Hypothyroidism: Euthyroid on replacement.   DVT prophylaxis: Lovenox subcu, will need postsurgical prophylaxis plan Code Status: Full code Family Communication: None Disposition Plan: patient is from home. Anticipated DC to skilled nursing rehab, Barriers to discharge going for surgery today   Consultants:   Orthopedics  Procedures:   Going for left hemitoday.  Antimicrobials:  Antibiotics Given (last 72 hours)    None         Subjective: Patient was seen and examined.  I examined patient before she was going for procedure in the morning rounds.  Denies any complaints when she is keeping her left leg is still.  It hurts on  movements.  She was ready to go for procedure.  Objective: Vitals:   12/20/19 1739 12/20/19 1900 12/21/19 0500 12/21/19 0748  BP: (!) 151/85 115/78 100/68 (!) 114/41  Pulse: (!) 102 94 71 79  Resp: 19 18 18 16   Temp: 99.5 F (37.5 C) 98.8 F (37.1 C) 98.9 F (37.2 C) 99.2 F (37.3 C)  TempSrc: Oral Oral Oral Oral  SpO2: 100% 99% 99% 98%  Weight:      Height:        Intake/Output Summary (Last 24 hours) at 12/21/2019 1323 Last data filed at 12/21/2019 1030 Gross per 24 hour  Intake 1254.87 ml  Output 450 ml  Net 804.87 ml   Filed Weights   12/20/19 1346  Weight: 50 kg    Examination:  General exam: Appears calm and comfortable, on room air. Respiratory system: Clear to auscultation. Respiratory effort normal.  No added sounds. Cardiovascular system: S1 & S2 heard, RRR. No JVD, murmurs, rubs, gallops or clicks. No pedal edema. Gastrointestinal system: Abdomen is nondistended, soft and nontender. No organomegaly or masses felt. Normal bowel sounds heard. Central nervous system: Alert and oriented. No focal neurological deficits. Extremities: Symmetric 5 x 5 power. Left leg was internally rotated.  No movement was elicited with presence of fracture. Skin: No rashes, lesions or ulcers Psychiatry: Judgement and insight appear normal. Mood & affect appropriate.     Data Reviewed: I have personally reviewed following labs and imaging studies  CBC: Recent Labs  Lab 12/20/19 1502 12/21/19 0346  WBC 7.1 6.8  NEUTROABS 6.0  --   HGB 11.9* 9.7*  HCT 37.4 30.4*  MCV  96.6 95.9  PLT 121* [id number]*   Basic Metabolic Panel: Recent Labs  Lab 12/20/19 1502 12/21/19 0346  NA 136 137  K 4.3 3.8  CL 104 107  CO2 16* 19*  GLUCOSE 150* 123*  BUN 30* 28*  CREATININE 1.75* 1.72*  CALCIUM 9.3 8.1*   GFR: Estimated Creatinine Clearance: 17.5 mL/min (A) (by C-G formula based on SCr of 1.72 mg/dL (H)). Liver Function Tests: Recent Labs  Lab 12/21/19 0346  AST 39  ALT 26    ALKPHOS 66  BILITOT 0.7  PROT 5.2*  ALBUMIN 3.1*   No results for input(s): LIPASE, AMYLASE in the last 168 hours. No results for input(s): AMMONIA in the last 168 hours. Coagulation Profile: Recent Labs  Lab 12/21/19 0346  INR 1.2   Cardiac Enzymes: No results for input(s): CKTOTAL, CKMB, CKMBINDEX, TROPONINI in the last 168 hours. BNP (last 3 results) No results for input(s): PROBNP in the last 8760 hours. HbA1C: No results for input(s): HGBA1C in the last 72 hours. CBG: No results for input(s): GLUCAP in the last 168 hours. Lipid Profile: No results for input(s): CHOL, HDL, LDLCALC, TRIG, CHOLHDL, LDLDIRECT in the last 72 hours. Thyroid Function Tests: Recent Labs    12/21/19 0346  TSH 4.647*   Anemia Panel: No results for input(s): VITAMINB12, FOLATE, FERRITIN, TIBC, IRON, RETICCTPCT in the last 72 hours. Sepsis Labs: No results for input(s): PROCALCITON, LATICACIDVEN in the last 168 hours.  Recent Results (from the past 240 hour(s))  SARS CORONAVIRUS 2 (TAT 6-24 HRS) Nasopharyngeal Nasopharyngeal Swab     Status: None   Collection Time: 12/20/19  4:11 PM   Specimen: Nasopharyngeal Swab  Result Value Ref Range Status   SARS Coronavirus 2 NEGATIVE NEGATIVE Final    Comment: (NOTE) SARS-CoV-2 target nucleic acids are NOT DETECTED. The SARS-CoV-2 RNA is generally detectable in upper and lower respiratory specimens during the acute phase of infection. Negative results do not preclude SARS-CoV-2 infection, do not rule out co-infections with other pathogens, and should not be used as the sole basis for treatment or other patient management decisions. Negative results must be combined with clinical observations, patient history, and epidemiological information. The expected result is Negative. Fact Sheet for Patients: SugarRoll.be Fact Sheet for Healthcare Providers: https://www.woods-mathews.com/ This test is not yet  approved or cleared by the [place] FDA and  has been authorized for detection and/or diagnosis of SARS-CoV-2 by FDA under an Emergency Use Authorization (EUA). This EUA will remain  in effect (meaning this test can be used) for the duration of the COVID-19 declaration under Section 56 4(b)(1) of the Act, 21 U.S.C. section 360bbb-3(b)(1), unless the authorization is terminated or revoked sooner. Performed at [facility], [place] [address], [place], [place] [address]   Surgical pcr screen     Status: Abnormal   Collection Time: 12/20/19  6:11 PM   Specimen: Nasal Mucosa; Nasal Swab  Result Value Ref Range Status   MRSA, PCR NEGATIVE NEGATIVE Final   Staphylococcus aureus POSITIVE (A) NEGATIVE Final    Comment: (NOTE) The Xpert SA Assay (FDA approved for NASAL specimens in patients 27 years of age and older), is one component of a comprehensive surveillance program. It is not intended to diagnose infection nor to guide or monitor treatment. Performed at [facility], [place] [address], [place], [place] [address]          Radiology Studies: DG Chest 1 View  Result Date: 12/20/2019 CLINICAL DATA:  Hip  fracture EXAM: CHEST  1 VIEW COMPARISON:  None. FINDINGS: Cardiac enlargement without heart failure. Atherosclerotic aortic arch. No acute abnormality chest. No infiltrate effusion or mass. Chronic fracture right medial clavicle IMPRESSION: No active disease. Electronically Signed   By: Franchot Gallo M.D.   On: 12/20/2019 15:02   DG Knee Complete 4 Views Left  Result Date: 12/20/2019 CLINICAL DATA:  Knee pain EXAM: LEFT KNEE - COMPLETE 4+ VIEW COMPARISON:  None. FINDINGS: Chronic fracture left tibial plateau fixed with 2 transverse screws. Soft tissue calcification in the medial collateral ligament. Medial joint space normal. Marked varus angulation of the knee. Thickening of the proximal fibula likely due to chronic fracture. Negative for acute fracture IMPRESSION: Chronic  fractures as above. Degenerative changes most prominent in the lateral joint compartment. No acute fracture. Electronically Signed   By: Franchot Gallo M.D.   On: 12/20/2019 15:00   DG Hip Unilat With Pelvis 2-3 Views Left  Result Date: 12/20/2019 CLINICAL DATA:  Left hip pain 2 weeks EXAM: DG HIP (WITH OR WITHOUT PELVIS) 2-3V LEFT COMPARISON:  09/04/2016 FINDINGS: Left femoral neck fracture with superior displacement of the femur as well as angulation and mild impaction. Left hip joint normal Normal right hip. No other pelvic lesions IMPRESSION: Left femoral neck fracture. Electronically Signed   By: Franchot Gallo M.D.   On: 12/20/2019 15:01        Scheduled Meds: . [MAR Hold] acetaminophen  1,000 mg Oral BID  . [MAR Hold] amLODipine  2.5 mg Oral BID  . [MAR Hold] atorvastatin  40 mg Oral Daily  . [MAR Hold] enoxaparin (LOVENOX) injection  30 mg Subcutaneous Q24H  . [MAR Hold] fenofibrate  160 mg Oral Q breakfast  . [MAR Hold] levothyroxine  25 mcg Oral Q0600  . [MAR Hold] mupirocin ointment  1 application Topical BID  . povidone-iodine  2 application Topical Once   Continuous Infusions: . sodium chloride 75 mL/hr at 12/21/19 1033  . [START ON 12/22/2019]  ceFAZolin (ANCEF) IV    . lactated ringers 10 mL/hr at 12/21/19 1306     LOS: 1 day    Time spent: 25 minutes    Barb Merino, MD [facility] Pager [phone]

## 2019-12-21 NOTE — Discharge Instructions (Signed)
°Dr. Kameshia Madruga °Joint Replacement Specialist °[facility] °[address] °, [place] [address] °[phone] ° ° °TOTAL HIP REPLACEMENT POSTOPERATIVE DIRECTIONS ° ° ° °Hip Rehabilitation, Guidelines Following Surgery  ° °WEIGHT BEARING °Weight bearing as tolerated with assist device (walker, cane, etc) as directed, use it as long as suggested by your surgeon or therapist, typically at least 4-6 weeks. ° °The results of a hip operation are greatly improved after range of motion and muscle strengthening exercises. Follow all safety measures which are given to protect your hip. If any of these exercises cause increased pain or swelling in your joint, decrease the amount until you are comfortable again. Then slowly increase the exercises. Call your caregiver if you have problems or questions.  ° °HOME CARE INSTRUCTIONS  °Most of the following instructions are designed to prevent the dislocation of your new hip.  °Remove items at home which could result in a fall. This includes throw rugs or furniture in walking pathways.  °Continue medications as instructed at time of discharge. °· You may have some home medications which will be placed on hold until you complete the course of blood thinner medication. °· You may start showering once you are discharged home. Do not remove your dressing. °Do not put on socks or shoes without following the instructions of your caregivers.   °Sit on chairs with arms. Use the chair arms to help push yourself up when arising.  °Arrange for the use of a toilet seat elevator so you are not sitting low.  °· Walk with walker as instructed.  °You may resume a sexual relationship in one month or when given the OK by your caregiver.  °Use walker as long as suggested by your caregivers.  °You may put full weight on your legs and walk as much as is comfortable. °Avoid periods of inactivity such as sitting longer than an hour when not asleep. This helps prevent  blood clots.  °You may return to work once you are cleared by your surgeon.  °Do not drive a car for 6 weeks or until released by your surgeon.  °Do not drive while taking narcotics.  °Wear elastic stockings for two weeks following surgery during the day but you may remove then at night.  °Make sure you keep all of your appointments after your operation with all of your doctors and caregivers. You should call the office at the above phone number and make an appointment for approximately two weeks after the date of your surgery. °Please pick up a stool softener and laxative for home use as long as you are requiring pain medications. °· ICE to the affected hip every three hours for 30 minutes at a time and then as needed for pain and swelling. Continue to use ice on the hip for pain and swelling from surgery. You may notice swelling that will progress down to the foot and ankle.  This is normal after surgery.  Elevate the leg when you are not up walking on it.   °It is important for you to complete the blood thinner medication as prescribed by your doctor. °· Continue to use the breathing machine which will help keep your temperature down.  It is common for your temperature to cycle up and down following surgery, especially at night when you are not up moving around and exerting yourself.  The breathing machine keeps your lungs expanded and your temperature down. ° °RANGE OF MOTION AND STRENGTHENING EXERCISES  °These exercises are   designed to help you keep full movement of your hip joint. Follow your caregiver's or physical therapist's instructions. Perform all exercises about fifteen times, three times per day or as directed. Exercise both hips, even if you have had only one joint replacement. These exercises can be done on a training (exercise) mat, on the floor, on a table or on a bed. Use whatever works the best and is most comfortable for you. Use music or television while you are exercising so that the exercises  are a pleasant break in your day. This will make your life better with the exercises acting as a break in routine you can look forward to.  °Lying on your back, slowly slide your foot toward your buttocks, raising your knee up off the floor. Then slowly slide your foot back down until your leg is straight again.  °Lying on your back spread your legs as far apart as you can without causing discomfort.  °Lying on your side, raise your upper leg and foot straight up from the floor as far as is comfortable. Slowly lower the leg and repeat.  °Lying on your back, tighten up the muscle in the front of your thigh (quadriceps muscles). You can do this by keeping your leg straight and trying to raise your heel off the floor. This helps strengthen the largest muscle supporting your knee.  °Lying on your back, tighten up the muscles of your buttocks both with the legs straight and with the knee bent at a comfortable angle while keeping your heel on the floor.  ° °SKILLED REHAB INSTRUCTIONS: °If the patient is transferred to a skilled rehab facility following release from the hospital, a list of the current medications will be sent to the facility for the patient to continue.  When discharged from the skilled rehab facility, please have the facility set up the patient's Home Health Physical Therapy prior to being released. Also, the skilled facility will be responsible for providing the patient with their medications at time of release from the facility to include their pain medication and their blood thinner medication. If the patient is still at the rehab facility at time of the two week follow up appointment, the skilled rehab facility will also need to assist the patient in arranging follow up appointment in our office and any transportation needs. ° °MAKE SURE YOU:  °Understand these instructions.  °Will watch your condition.  °Will get help right away if you are not doing well or get worse. ° °Pick up stool softner and  laxative for home use following surgery while on pain medications. °Do not remove your dressing. °The dressing is waterproof--it is OK to take showers. °Continue to use ice for pain and swelling after surgery. °Do not use any lotions or creams on the incision until instructed by your surgeon. °Total Hip Protocol. ° ° °

## 2019-12-21 NOTE — Op Note (Addendum)
OPERATIVE REPORT  SURGEON: Rod Can, MD   ASSISTANT: Ky Barban, RNFA  PREOPERATIVE DIAGNOSIS: Displaced Left femoral neck fracture.   POSTOPERATIVE DIAGNOSIS: Displaced Left femoral neck fracture.   PROCEDURE: Left total hip arthroplasty, anterior approach.   IMPLANTS: DePuy Tri Lock stem, size 3, std offset. DePuy Pinnacle Cup, size 48 mm. DePuy Altrx liner, size 32 by 48 mm, neutral. DePuy metal head ball, size 32 + 1 mm.  ANESTHESIA:  General  ANTIBIOTICS: 2g ancef.  ESTIMATED BLOOD LOSS:-250 mL    DRAINS: None.  SPECIMENS: Left femoral neck remnant and head for pathology.  COMPLICATIONS: None   CONDITION: PACU - hemodynamically stable.   BRIEF CLINICAL NOTE: Diana Villa is a 86 y.o. female with a displaced Left femoral neck fracture. The patient was admitted to the hospitalist service and underwent perioperative risk stratification and medical optimization. The risks, benefits, and alternatives to total hip arthroplasty were explained, and the patient elected to proceed.  PROCEDURE IN DETAIL: The patient was taken to the operating room and general anesthesia was induced on the hospital bed.  The patient was then positioned on the Hana table.  All bony prominences were well padded.  The hip was prepped and draped in the normal sterile surgical fashion.  A time-out was called verifying side and site of surgery. Antibiotics were given within 60 minutes of beginning the procedure.  The direct anterior approach to the hip was performed through the Hueter interval.  Lateral femoral circumflex vessels were treated with the Auqumantys. The anterior capsule was exposed and an inverted T capsulotomy was made.  Fracture hematoma was encountered and evacuated. The patient was found to have a comminuted Left subcapital femoral neck fracture. There was no obvious evidence of tumor.  I freshened the femoral neck cut with a saw.  I removed the femoral neck fragment.  A  corkscrew was placed into the head and the head was removed.  This was passed to the back table and was measured. The femoral head and neck remnant were sent for pathology.   Acetabular exposure was achieved, and the pulvinar and labrum were excised. Sequential reaming of the acetabulum was then performed up to a size 47 mm reamer. A 48 mm cup was then opened and impacted into place at approximately 40 degrees of abduction and 20 degrees of anteversion. The final polyethylene liner was impacted into place.    I then gained femoral exposure taking care to protect the abductors and greater trochanter.  This was performed using standard external rotation, extension, and adduction.  The capsule was peeled off the inner aspect of the greater trochanter, taking care to preserve the short external rotators. A cookie cutter was used to enter the femoral canal, and then the femoral canal finder was used to confirm location.  I then sequentially broached up to a size 3.  Calcar planer was used on the femoral neck remnant.  I paced a std neck and a trial head ball. The hip was reduced.  Leg lengths were checked fluoroscopically.  The hip was dislocated and trial components were removed.  I placed the real stem followed by the real spacer and head ball.  The hip was reduced.  Fluoroscopy was used to confirm component position and leg lengths.  At 90 degrees of external rotation and extension, the hip was stable to an anterior directed force.   The wound was copiously irrigated with Irrisept solution and normal saline using pule lavage.  Marcaine solution was injected into  the periarticular soft tissue.  The wound was closed in layers using #1 Vicryl and V-Loc for the fascia, 2-0 Vicryl for the subcutaneous fat, 2-0 Monocryl for the deep dermal layer, 3-0 running Monocryl subcuticular stitch and glue for the skin.  Once the glue was fully dried, an Aquacell Ag dressing was applied.  The patient was then awakened from  anesthesia and transported to the recovery room in stable condition.  Sponge, needle, and instrument counts were correct at the end of the case x2.  The patient tolerated the procedure well and there were no known complications.

## 2019-12-21 NOTE — Anesthesia Procedure Notes (Signed)
Procedure Name: Intubation Date/Time: 12/21/2019 1:53 PM Performed by: Valda Favia, CRNA Pre-anesthesia Checklist: Patient identified, Emergency Drugs available, Suction available and Patient being monitored Patient Re-evaluated:Patient Re-evaluated prior to induction Oxygen Delivery Method: Circle System Utilized Preoxygenation: Pre-oxygenation with 100% oxygen Induction Type: IV induction Ventilation: Mask ventilation without difficulty Laryngoscope Size: Mac and 4 Grade View: Grade I Tube type: Oral Tube size: 7.0 mm Number of attempts: 1 Airway Equipment and Method: Stylet and Oral airway Placement Confirmation: ETT inserted through vocal cords under direct vision,  positive ETCO2 and breath sounds checked- equal and bilateral Secured at: 20 cm Tube secured with: Tape Dental Injury: Teeth and Oropharynx as per pre-operative assessment

## 2019-12-21 NOTE — Plan of Care (Signed)

## 2019-12-21 NOTE — Interval H&P Note (Signed)
History and Physical Interval Note:  12/21/2019 12:40 PM  Diana Villa  has presented today for surgery, with the diagnosis of left femoral neck fracture.  The various methods of treatment have been discussed with the patient and family. After consideration of risks, benefits and other options for treatment, the patient has consented to  Procedure(s): TOTAL HIP ARTHROPLASTY ANTERIOR APPROACH (Left) as a surgical intervention.  The patient's history has been reviewed, patient examined, no change in status, stable for surgery.  I have reviewed the patient's chart and labs.  Questions were answered to the patient's satisfaction.    The risks, benefits, and alternatives were discussed with the patient. There are risks associated with the surgery including, but not limited to, problems with anesthesia (death), infection, instability (giving out of the joint), dislocation, differences in leg length/angulation/rotation, fracture of bones, loosening or failure of implants, hematoma (blood accumulation) which may require surgical drainage, blood clots, pulmonary embolism, nerve injury (foot drop and lateral thigh numbness), and blood vessel injury. The patient understands these risks and elects to proceed.   Hilton Cork Jini Horiuchi

## 2019-12-22 ENCOUNTER — Encounter: Payer: Self-pay | Admitting: *Deleted

## 2019-12-22 LAB — CBC WITH DIFFERENTIAL/PLATELET
Abs Immature Granulocytes: 0.02 10*3/uL (ref 0.00–0.07)
Basophils Absolute: 0 10*3/uL (ref 0.0–0.1)
Basophils Relative: 0 %
Eosinophils Absolute: 0 10*3/uL (ref 0.0–0.5)
Eosinophils Relative: 0 %
HCT: 21.6 % — ABNORMAL LOW (ref 36.0–46.0)
Hemoglobin: 6.8 g/dL — CL (ref 12.0–15.0)
Immature Granulocytes: 0 %
Lymphocytes Relative: 24 %
Lymphs Abs: 1.7 10*3/uL (ref 0.7–4.0)
MCH: 30.9 pg (ref 26.0–34.0)
MCHC: 31.5 g/dL (ref 30.0–36.0)
MCV: 98.2 fL (ref 80.0–100.0)
Monocytes Absolute: 1.2 10*3/uL — ABNORMAL HIGH (ref 0.1–1.0)
Monocytes Relative: 17 %
Neutro Abs: 4.1 10*3/uL (ref 1.7–7.7)
Neutrophils Relative %: 59 %
Platelets: 89 10*3/uL — ABNORMAL LOW (ref 150–400)
RBC: 2.2 MIL/uL — ABNORMAL LOW (ref 3.87–5.11)
RDW: 16.1 % — ABNORMAL HIGH (ref 11.5–15.5)
WBC: 7 10*3/uL (ref 4.0–10.5)
nRBC: 0 % (ref 0.0–0.2)

## 2019-12-22 LAB — BASIC METABOLIC PANEL
Anion gap: 9 (ref 5–15)
BUN: 27 mg/dL — ABNORMAL HIGH (ref 8–23)
CO2: 18 mmol/L — ABNORMAL LOW (ref 22–32)
Calcium: 7.6 mg/dL — ABNORMAL LOW (ref 8.9–10.3)
Chloride: 110 mmol/L (ref 98–111)
Creatinine, Ser: 1.96 mg/dL — ABNORMAL HIGH (ref 0.44–1.00)
GFR calc Af Amer: 27 mL/min — ABNORMAL LOW (ref >60)
GFR calc non Af Amer: 23 mL/min — ABNORMAL LOW (ref >60)
Glucose, Bld: 118 mg/dL — ABNORMAL HIGH (ref 70–99)
Potassium: 4.7 mmol/L (ref 3.5–5.1)
Sodium: 137 mmol/L (ref 135–145)

## 2019-12-22 LAB — HEMOGLOBIN AND HEMATOCRIT, BLOOD
HCT: 25 % — ABNORMAL LOW (ref 36.0–46.0)
Hemoglobin: 8.4 g/dL — ABNORMAL LOW (ref 12.0–15.0)

## 2019-12-22 LAB — PHOSPHORUS: Phosphorus: 4.6 mg/dL (ref 2.5–4.6)

## 2019-12-22 LAB — PREPARE RBC (CROSSMATCH)

## 2019-12-22 LAB — ABO/RH: ABO/RH(D): A NEG

## 2019-12-22 MED ORDER — ASPIRIN 81 MG PO CHEW: 81.0000 mg | CHEWABLE_TABLET | Freq: Two times a day (BID) | ORAL | 0 refills | Status: AC

## 2019-12-22 MED ORDER — SODIUM CHLORIDE 0.9% IV SOLUTION: Freq: Once | INTRAVENOUS | Status: DC

## 2019-12-22 MED ORDER — LACTATED RINGERS IV BOLUS
500.0000 mL | Freq: Once | INTRAVENOUS | Status: AC
  Administered 2019-12-22: 500 mL via INTRAVENOUS

## 2019-12-22 MED ORDER — TRAMADOL HCL 50 MG PO TABS: 50.0000 mg | ORAL_TABLET | Freq: Four times a day (QID) | ORAL | 0 refills | Status: AC | PRN

## 2019-12-22 MED ORDER — ASPIRIN 81 MG PO CHEW
81.0000 mg | CHEWABLE_TABLET | Freq: Two times a day (BID) | ORAL | Status: DC
  Administered 2019-12-23 – 2019-12-27 (×9): 81 mg via ORAL
  Filled 2019-12-22 (×9): qty 1

## 2019-12-22 NOTE — Progress Notes (Signed)
PROGRESS NOTE    Diana Villa  QQP:[id number] DOB: 1933-08-07 DOA: 12/20/2019 PCP: Patient, No Pcp Per    Brief Narrative:  86 year old female with history of hypertension, hyperlipidemia, hypothyroidism, stage III chronic kidney disease, anemia of chronic disease presented to the emergency room with about 2 weeks of pain and difficulty ambulating.  She was treated outpatient with pain medications with no relief.  No particular history of trauma.  In the emergency room, fairly stable.  X-ray of the hip showed left femoral neck fracture.  COVID-19 negative.  Chest x-ray negative for acute disease.  Admitted for surgical intervention.   Assessment & Plan:   Active Problems:   Left displaced femoral neck fracture (HCC)   Essential hypertension   CKD (chronic kidney disease), stage III   Hyperlipidemia   Hypothyroidism  Left femoral neck fracture, closed, spontaneous with trivial trauma: Status post left hip hemiarthroplasty.  Surgically stable as per surgery.  Continue adequate pain medications.  Work with PT OT.  Patient will probably need inpatient therapies.  Pending PT OT work-up.   Weightbearing as tolerated. Transition to oral pain medications. Aspirin 81 mg twice a daily as per surgery.  Anemia, acute on chronic with expected blood loss from surgery: Hemoglobin is less than 7.  Hemodynamically stable.  Transfused 1 unit of PRBC today, recheck hemoglobin and transfuse for less than 7.  Essential hypertension: Blood pressures are low normal.  Discontinue antihypertensives for time being.  Hyperlipidemia: On a statin.  Chronic kidney disease stage IIIb with mild metabolic acidosis: Stable.  Treated with IV fluid hydration.  Continue.  Recheck tomorrow morning.  Hypothyroidism: Euthyroid on replacement.   DVT prophylaxis: Aspirin.  Lovenox on hold with risk of bleeding and anemia. Code Status: Full code Family Communication: None, patient's niece called, unable to talk.  Disposition Plan: patient is from home. Anticipated DC to skilled nursing rehab, Barriers to discharge, immediate postop.  Unable to work with PT OT today due to low hemoglobin.    Consultants:   Orthopedics  Procedures:   Left hip hemiarthroplasty, 3/3.  Antimicrobials:  Antibiotics Given (last 72 hours)    Date/Time Action Medication Dose Rate   12/21/19 1359 Given   ceFAZolin (ANCEF) IVPB 2g/100 mL premix 2 g    12/21/19 2025 New Bag/Given   ceFAZolin (ANCEF) IVPB 2g/100 mL premix 2 g 200 mL/hr   12/22/19 0117 New Bag/Given   ceFAZolin (ANCEF) IVPB 2g/100 mL premix 2 g 200 mL/hr         Subjective: Patient seen and examined.  Receiving transfusion.  Left hip hurts when she moves around.  No other overnight events.  Objective: Vitals:   12/22/19 0925 12/22/19 0941 12/22/19 1215 12/22/19 1230  BP: (!) 114/45 (!) 101/38 (!) 97/41 (!) 91/42  Pulse: 75 71 86 84  Resp: 14 14 14    Temp: 98.5 F (36.9 C) 98.7 F (37.1 C) 98.6 F (37 C) 99.6 F (37.6 C)  TempSrc: Oral Oral Oral Oral  SpO2: 100% 100% 99% 98%  Weight:      Height:        Intake/Output Summary (Last 24 hours) at 12/22/2019 1320 Last data filed at 12/22/2019 0600 Gross per 24 hour  Intake 3122.21 ml  Output 1100 ml  Net 2022.21 ml   Filed Weights   12/20/19 1346  Weight: 50 kg    Examination:  General exam: Appears calm and comfortable, on room air.  Anxious.  Pale. Respiratory system: Clear to auscultation.  Cardiovascular system:  S1 & S2 heard, RRR.  Gastrointestinal system: Abdomen is nondistended, soft and nontender. No organomegaly or masses felt. Normal bowel sounds heard. Central nervous system: Alert and oriented. No focal neurological deficits. Extremities: Symmetric 5 x 5 power. Left hip.  Incision clean and dry, minimal swelling around the incision. Skin: No rashes, lesions or ulcers Psychiatry: Judgement and insight appear normal. Mood & affect appropriate.     Data Reviewed: I  have personally reviewed following labs and imaging studies  CBC: Recent Labs  Lab 12/20/19 1502 12/21/19 0346 12/22/19 0404  WBC 7.1 6.8 7.0  NEUTROABS 6.0  --  4.1  HGB 11.9* 9.7* 6.8*  HCT 37.4 30.4* 21.6*  MCV 96.6 95.9 98.2  PLT 121* 122* 89*   Basic Metabolic Panel: Recent Labs  Lab 12/20/19 1502 12/21/19 0346 12/22/19 0404  NA 136 137 137  K 4.3 3.8 4.7  CL 104 107 110  CO2 16* 19* 18*  GLUCOSE 150* 123* 118*  BUN 30* 28* 27*  CREATININE 1.75* 1.72* 1.96*  CALCIUM 9.3 8.1* 7.6*  PHOS  --   --  4.6   GFR: Estimated Creatinine Clearance: 15.3 mL/min (A) (by C-G formula based on SCr of 1.96 mg/dL (H)). Liver Function Tests: Recent Labs  Lab 12/21/19 0346  AST 39  ALT 26  ALKPHOS 66  BILITOT 0.7  PROT 5.2*  ALBUMIN 3.1*   No results for input(s): LIPASE, AMYLASE in the last 168 hours. No results for input(s): AMMONIA in the last 168 hours. Coagulation Profile: Recent Labs  Lab 12/21/19 0346  INR 1.2   Cardiac Enzymes: No results for input(s): CKTOTAL, CKMB, CKMBINDEX, TROPONINI in the last 168 hours. BNP (last 3 results) No results for input(s): PROBNP in the last 8760 hours. HbA1C: No results for input(s): HGBA1C in the last 72 hours. CBG: No results for input(s): GLUCAP in the last 168 hours. Lipid Profile: No results for input(s): CHOL, HDL, LDLCALC, TRIG, CHOLHDL, LDLDIRECT in the last 72 hours. Thyroid Function Tests: Recent Labs    12/21/19 0346  TSH 4.647*   Anemia Panel: No results for input(s): VITAMINB12, FOLATE, FERRITIN, TIBC, IRON, RETICCTPCT in the last 72 hours. Sepsis Labs: No results for input(s): PROCALCITON, LATICACIDVEN in the last 168 hours.  Recent Results (from the past 240 hour(s))  SARS CORONAVIRUS 2 (TAT 6-24 HRS) Nasopharyngeal Nasopharyngeal Swab     Status: None   Collection Time: 12/20/19  4:11 PM   Specimen: Nasopharyngeal Swab  Result Value Ref Range Status   SARS Coronavirus 2 NEGATIVE NEGATIVE Final     Comment: (NOTE) SARS-CoV-2 target nucleic acids are NOT DETECTED. The SARS-CoV-2 RNA is generally detectable in upper and lower respiratory specimens during the acute phase of infection. Negative results do not preclude SARS-CoV-2 infection, do not rule out co-infections with other pathogens, and should not be used as the sole basis for treatment or other patient management decisions. Negative results must be combined with clinical observations, patient history, and epidemiological information. The expected result is Negative. Fact Sheet for Patients: SugarRoll.be Fact Sheet for Healthcare Providers: https://www.woods-mathews.com/ This test is not yet approved or cleared by the [place] FDA and  has been authorized for detection and/or diagnosis of SARS-CoV-2 by FDA under an Emergency Use Authorization (EUA). This EUA will remain  in effect (meaning this test can be used) for the duration of the COVID-19 declaration under Section 56 4(b)(1) of the Act, 21 U.S.C. section 360bbb-3(b)(1), unless the authorization is terminated or revoked sooner. Performed  at [facility], [place] [address] [place], [place] [address]   Surgical pcr screen     Status: Abnormal   Collection Time: 12/20/19  6:11 PM   Specimen: Nasal Mucosa; Nasal Swab  Result Value Ref Range Status   MRSA, PCR NEGATIVE NEGATIVE Final   Staphylococcus aureus POSITIVE (A) NEGATIVE Final    Comment: (NOTE) The Xpert SA Assay (FDA approved for NASAL specimens in patients 79 years of age and older), is one component of a comprehensive surveillance program. It is not intended to diagnose infection nor to guide or monitor treatment. Performed at [facility], [place] [address] [place], [place] [address]          Radiology Studies: DG Chest 1 View  Result Date: 12/20/2019 CLINICAL DATA:  Hip fracture EXAM: CHEST  1 VIEW COMPARISON:  None. FINDINGS: Cardiac  enlargement without heart failure. Atherosclerotic aortic arch. No acute abnormality chest. No infiltrate effusion or mass. Chronic fracture right medial clavicle IMPRESSION: No active disease. Electronically Signed   By: Franchot Gallo M.D.   On: 12/20/2019 15:02   Pelvis Portable  Result Date: 12/21/2019 CLINICAL DATA:  Left hip replacement EXAM: PORTABLE PELVIS 1-2 VIEWS COMPARISON:  12/20/2019 FINDINGS: Changes of left hip replacement. Normal AP alignment. No hardware bony complicating feature. IMPRESSION: Left hip replacement.  No visible complicating feature. Electronically Signed   By: Rolm Baptise M.D.   On: 12/21/2019 16:35   DG Knee Complete 4 Views Left  Result Date: 12/20/2019 CLINICAL DATA:  Knee pain EXAM: LEFT KNEE - COMPLETE 4+ VIEW COMPARISON:  None. FINDINGS: Chronic fracture left tibial plateau fixed with 2 transverse screws. Soft tissue calcification in the medial collateral ligament. Medial joint space normal. Marked varus angulation of the knee. Thickening of the proximal fibula likely due to chronic fracture. Negative for acute fracture IMPRESSION: Chronic fractures as above. Degenerative changes most prominent in the lateral joint compartment. No acute fracture. Electronically Signed   By: Franchot Gallo M.D.   On: 12/20/2019 15:00   DG C-Arm 1-60 Min  Result Date: 12/21/2019 CLINICAL DATA:  ORIF left hip EXAM: DG C-ARM 1-60 MIN; OPERATIVE LEFT HIP WITH PELVIS COMPARISON:  12/20/2019 FINDINGS: Changes of left hip replacement. Normal AP alignment. No hardware or bony complicating feature. IMPRESSION: Left hip replacement.  No visible complicating feature. Electronically Signed   By: Rolm Baptise M.D.   On: 12/21/2019 16:33   DG HIP OPERATIVE UNILAT W OR W/O PELVIS LEFT  Result Date: 12/21/2019 CLINICAL DATA:  ORIF left hip EXAM: DG C-ARM 1-60 MIN; OPERATIVE LEFT HIP WITH PELVIS COMPARISON:  12/20/2019 FINDINGS: Changes of left hip replacement. Normal AP alignment. No hardware or  bony complicating feature. IMPRESSION: Left hip replacement.  No visible complicating feature. Electronically Signed   By: Rolm Baptise M.D.   On: 12/21/2019 16:33   DG Hip Unilat With Pelvis 2-3 Views Left  Result Date: 12/20/2019 CLINICAL DATA:  Left hip pain 2 weeks EXAM: DG HIP (WITH OR WITHOUT PELVIS) 2-3V LEFT COMPARISON:  09/04/2016 FINDINGS: Left femoral neck fracture with superior displacement of the femur as well as angulation and mild impaction. Left hip joint normal Normal right hip. No other pelvic lesions IMPRESSION: Left femoral neck fracture. Electronically Signed   By: Franchot Gallo M.D.   On: 12/20/2019 15:01        Scheduled Meds: . sodium chloride   Intravenous Once  . acetaminophen  1,000 mg Oral BID  . aspirin  81  mg Oral BID WC  . atorvastatin  40 mg Oral Daily  . docusate sodium  100 mg Oral BID  . fenofibrate  160 mg Oral Q breakfast  . levothyroxine  25 mcg Oral Q0600  . mupirocin ointment  1 application Topical BID   Continuous Infusions: . sodium chloride 75 mL/hr at 12/21/19 1900     LOS: 2 days    Time spent: 25 minutes    Barb Merino, MD [facility] Pager [phone]

## 2019-12-22 NOTE — Plan of Care (Signed)
  Problem: Safety: Goal: Ability to remain free from injury will improve Outcome: Progressing   

## 2019-12-22 NOTE — Progress Notes (Addendum)
CRITICAL VALUE ALERT  Critical Value:  Hgb 6.8  Date & Time Notied:  12/22/19   0555  Provider Notified: Dr. Baltazar Najjar  Orders Received/Actions taken: Blood ordered

## 2019-12-22 NOTE — Plan of Care (Signed)

## 2019-12-22 NOTE — Anesthesia Postprocedure Evaluation (Signed)
Anesthesia Post Note  Patient: Diana Villa  Procedure(s) Performed: TOTAL HIP ARTHROPLASTY ANTERIOR APPROACH (Left Hip)     Patient location during evaluation: PACU Anesthesia Type: General Level of consciousness: sedated Pain management: pain level controlled Vital Signs Assessment: post-procedure vital signs reviewed and stable Respiratory status: spontaneous breathing and respiratory function stable Cardiovascular status: stable Postop Assessment: no apparent nausea or vomiting Anesthetic complications: no                  Malissia Rabbani DANIEL

## 2019-12-22 NOTE — Progress Notes (Signed)
[facility] paged due to pt's blood pressure 91/42. Blood transfusion complete.

## 2019-12-22 NOTE — Progress Notes (Signed)
PT Cancellation Note  Patient Details Name: Diana Villa MRN: [id number] DOB: 1933-09-07   Cancelled Treatment:    Reason Eval/Treat Not Completed: Patient not medically ready. Attempted x 2 to see pt.  Pt with hgb 6.8 and awaiting blood this am.  Blood now running but lunch arrived and pt deferred due to meal. Will attempt next date.   [name] 12/22/2019, 12:01 PM Bayard Males, PT Acute Rehabilitation Services Pager: [phone] Office: [phone]

## 2019-12-22 NOTE — Progress Notes (Signed)
    Subjective:  Patient reports pain as mild to moderate.  Denies N/V/CP/SOB.   Objective:   VITALS:   Vitals:   12/22/19 0440 12/22/19 0822 12/22/19 0925 12/22/19 0941  BP: (!) 89/36 (!) 97/34 (!) 114/45 (!) 101/38  Pulse: 72 70 75 71  Resp: 16 16 14 14   Temp: 98.6 F (37 C) 98.5 F (36.9 C) 98.5 F (36.9 C) 98.7 F (37.1 C)  TempSrc: Oral Oral Oral Oral  SpO2: 99% 99% 100% 100%  Weight:      Height:        NAD ABD soft Sensation intact distally Intact pulses distally Dorsiflexion/Plantar flexion intact Incision: dressing C/D/I Compartment soft No hematoma  Lab Results  Component Value Date   WBC 7.0 12/22/2019   HGB 6.8 (LL) 12/22/2019   HCT 21.6 (L) 12/22/2019   MCV 98.2 12/22/2019   PLT 89 (L) 12/22/2019   BMET    Component Value Date/Time   NA 137 12/22/2019 0404   K 4.7 12/22/2019 0404   CL 110 12/22/2019 0404   CO2 18 (L) 12/22/2019 0404   GLUCOSE 118 (H) 12/22/2019 0404   BUN 27 (H) 12/22/2019 0404   CREATININE 1.96 (H) 12/22/2019 0404   CALCIUM 7.6 (L) 12/22/2019 0404   CALCIUM 8.8 08/26/2013 1035   GFRNONAA 23 (L) 12/22/2019 0404   GFRAA 27 (L) 12/22/2019 0404     Assessment/Plan: 1 Day Post-Op   Active Problems:   Left displaced femoral neck fracture (HCC)   Essential hypertension   CKD (chronic kidney disease), stage III   Hyperlipidemia   Hypothyroidism   WBAT with walker DVT ppx: switch lovenox to ASA due to hgb drop - risk of bleeding, SCDs, TEDS PO pain control PT/OT Dispo: D/C planning   Hilton Cork Xitlaly Ault 12/22/2019, 11:11 AM   Rod Can, MD [phone] [place] is now [facility]  [facility] [address], [address], [place], [place] [address] Phone: [phone] [email or web address] Facebook  [employer]

## 2019-12-23 LAB — CBC
HCT: 23.9 % — ABNORMAL LOW (ref 36.0–46.0)
Hemoglobin: 7.9 g/dL — ABNORMAL LOW (ref 12.0–15.0)
MCH: 30.5 pg (ref 26.0–34.0)
MCHC: 33.1 g/dL (ref 30.0–36.0)
MCV: 92.3 fL (ref 80.0–100.0)
Platelets: 86 10*3/uL — ABNORMAL LOW (ref 150–400)
RBC: 2.59 MIL/uL — ABNORMAL LOW (ref 3.87–5.11)
RDW: 16.8 % — ABNORMAL HIGH (ref 11.5–15.5)
WBC: 5.9 10*3/uL (ref 4.0–10.5)
nRBC: 0 % (ref 0.0–0.2)

## 2019-12-23 LAB — BASIC METABOLIC PANEL
Anion gap: 9 (ref 5–15)
BUN: 27 mg/dL — ABNORMAL HIGH (ref 8–23)
CO2: 18 mmol/L — ABNORMAL LOW (ref 22–32)
Calcium: 7.5 mg/dL — ABNORMAL LOW (ref 8.9–10.3)
Chloride: 112 mmol/L — ABNORMAL HIGH (ref 98–111)
Creatinine, Ser: 2.1 mg/dL — ABNORMAL HIGH (ref 0.44–1.00)
GFR calc Af Amer: 24 mL/min — ABNORMAL LOW (ref >60)
GFR calc non Af Amer: 21 mL/min — ABNORMAL LOW (ref >60)
Glucose, Bld: 117 mg/dL — ABNORMAL HIGH (ref 70–99)
Potassium: 4.5 mmol/L (ref 3.5–5.1)
Sodium: 139 mmol/L (ref 135–145)

## 2019-12-23 LAB — BPAM RBC
Blood Product Expiration Date: 202103162359
ISSUE DATE / TIME: 202103040859
Unit Type and Rh: 600

## 2019-12-23 LAB — TYPE AND SCREEN
ABO/RH(D): A NEG
Antibody Screen: NEGATIVE
Unit division: 0

## 2019-12-23 NOTE — Plan of Care (Signed)

## 2019-12-23 NOTE — Evaluation (Addendum)
Physical Therapy Evaluation Patient Details Name: Diana Villa MRN: [id number] DOB: 10-04-1933 Today's Date: 12/23/2019   History of Present Illness  86 yo admitted with hip pain unable to ambulate without report of fall. Pt with left hip fx s/p anterior THA. PMHx: HTN, HLD, CKD, anemia  Clinical Impression  Pt pleasant and reports no pain at rest but 5-6/10 pain with activity, able and willing to progress mobility. Pt normally very independent lives by herself with her dog and still drives and takes care of her house. Pt with decreased transfers, gait and mobility limited by pain who will benefit from acute therapy to maximize mobility function and safety to decrease burden of care. Pt also reports very supportive niece and additional family who are trying to arrange additional assist for her at home.      Follow Up Recommendations CIR;Supervision/Assistance - 24 hour(SNF if CIR declines)    Equipment Recommendations  3in1 (PT)    Recommendations for Other Services OT consult;Rehab consult     Precautions / Restrictions Precautions Precautions: Fall Restrictions Weight Bearing Restrictions: Yes LLE Weight Bearing: Weight bearing as tolerated      Mobility  Bed Mobility Overal bed mobility: Needs Assistance Bed Mobility: Supine to Sit     Supine to sit: Mod assist;HOB elevated     General bed mobility comments: HOB 25 degrees assist to move LLE and elevate trunk with increased time and cues  Transfers Overall transfer level: Needs assistance   Transfers: Sit to/from Stand Sit to Stand: Min assist         General transfer comment: min assist to rise from bed with cues for hand placement  Ambulation/Gait Ambulation/Gait assistance: Min assist Gait Distance (Feet): 15 Feet Assistive device: Rolling walker (2 wheeled) Gait Pattern/deviations: Step-to pattern;Decreased stride length;Trunk flexed   Gait velocity interpretation: <1.31 ft/sec, indicative of household  ambulator General Gait Details: cues for posture and proximity to RW with assist to maintain control of RW, step to pattern with left stance limited  Stairs            Wheelchair Mobility    Modified Rankin (Stroke Patients Only)       Balance Overall balance assessment: Needs assistance   Sitting balance-Leahy Scale: Good     Standing balance support: Bilateral upper extremity supported Standing balance-Leahy Scale: Poor                               Pertinent Vitals/Pain Pain Assessment: 0-10 Pain Score: 5  Pain Location: left hip with activity Pain Descriptors / Indicators: Aching;Tender Pain Intervention(s): Limited activity within patient's tolerance;Monitored during session;Repositioned;Patient requesting pain meds-RN notified    Home Living Family/patient expects to be discharged to:: Private residence Living Arrangements: Alone Available Help at Discharge: Family;Available PRN/intermittently Type of Home: House Home Access: Stairs to enter   Entrance Stairs-Number of Steps: 1 Home Layout: One level Home Equipment: Cane - single point;Walker - 2 wheels      Prior Function Level of Independence: Independent with assistive device(s)         Comments: drives, shops, cook, lives with her dog. Niece lives nearby     Hand Dominance        Extremity/Trunk Assessment   Upper Extremity Assessment Upper Extremity Assessment: Generalized weakness    Lower Extremity Assessment Lower Extremity Assessment: Generalized weakness;LLE deficits/detail LLE Deficits / Details: decreased rOM and strength post op    Cervical / Trunk  Assessment Cervical / Trunk Assessment: Kyphotic  Communication   Communication: HOH  Cognition Arousal/Alertness: Awake/alert Behavior During Therapy: WFL for tasks assessed/performed Overall Cognitive Status: Impaired/Different from baseline Area of Impairment: Problem solving                              Problem Solving: Slow processing        General Comments      Exercises General Exercises - Lower Extremity Long Arc Quad: AAROM;Left;Seated;5 reps Heel Slides: AAROM;Left;Supine;5 reps Hip ABduction/ADduction: AAROM;Left;Supine;5 reps   Assessment/Plan    PT Assessment Patient needs continued PT services  PT Problem List Decreased strength;Decreased mobility;Decreased safety awareness;Decreased activity tolerance;Decreased balance;Decreased knowledge of use of DME;Pain;Decreased range of motion       PT Treatment Interventions DME instruction;Therapeutic exercise;Gait training;Balance training;Functional mobility training;Therapeutic activities;Patient/family education    PT Goals (Current goals can be found in the Care Plan section)  Acute Rehab PT Goals Patient Stated Goal: return home to my dog PT Goal Formulation: With patient Time For Goal Achievement: 01/06/20 Potential to Achieve Goals: Good    Frequency Min 5X/week   Barriers to discharge Decreased caregiver support      Co-evaluation               AM-PAC PT "6 Clicks" Mobility  Outcome Measure Help needed turning from your back to your side while in a flat bed without using bedrails?: A Little Help needed moving from lying on your back to sitting on the side of a flat bed without using bedrails?: A Little Help needed moving to and from a bed to a chair (including a wheelchair)?: A Little Help needed standing up from a chair using your arms (e.g., wheelchair or bedside chair)?: A Little Help needed to walk in hospital room?: A Little Help needed climbing 3-5 steps with a railing? : A Lot 6 Click Score: 17    End of Session Equipment Utilized During Treatment: Gait belt Activity Tolerance: Patient tolerated treatment well Patient left: in chair;with call bell/phone within reach;with chair alarm set Nurse Communication: Mobility status;Weight bearing status PT Visit Diagnosis: Other  abnormalities of gait and mobility (R26.89);Difficulty in walking, not elsewhere classified (R26.2);History of falling (Z91.81);Muscle weakness (generalized) (M62.81)    Time: 7939-0300 PT Time Calculation (min) (ACUTE ONLY): 30 min   Charges:   PT Evaluation $PT Eval Moderate Complexity: 1 Mod PT Treatments $Gait Training: 8-22 mins        Zandria Woldt P, PT Acute Rehabilitation Services Pager: [phone] Office: [place] 12/23/2019, 11:57 AM

## 2019-12-23 NOTE — Progress Notes (Signed)
PROGRESS NOTE    Diana Villa  WGN:[id number] DOB: [birth year] DOA: 12/20/2019 PCP: Patient, No Pcp Per    Brief Narrative:  86 year old female with history of hypertension, hyperlipidemia, hypothyroidism, stage III chronic kidney disease, anemia of chronic disease presented to the emergency room with about 2 weeks of pain and difficulty ambulating.  She was treated outpatient with pain medications with no relief.  No particular history of trauma.  In the emergency room, fairly stable.  X-ray of the hip showed left femoral neck fracture.  COVID-19 negative.  Chest x-ray negative for acute disease.  Admitted for surgical intervention.   Assessment & Plan:   Active Problems:   Left displaced femoral neck fracture (HCC)   Essential hypertension   CKD (chronic kidney disease), stage III   Hyperlipidemia   Hypothyroidism  Left femoral neck fracture, closed, spontaneous with trivial trauma: Status post left hip hemiarthroplasty 12/21/19.  Surgically stable as per surgery.  Continue adequate pain medications.  Work with PT OT. Refer to SNF for inpatient therapies.   Weightbearing as tolerated. Transition to oral pain medications.  Pain controlled with tramadol. Aspirin 81 mg twice a daily as per surgery.  Anemia, acute on chronic with expected blood loss from surgery: Hemoglobin dropped less than 7.  She received 1 unit of PRBC transfusion.  Does have chronic kidney disease and anemia of chronic disease.  She will need close monitoring, advise recheck in 1 week.  Appropriately responded to transfusion.  Essential hypertension: Blood pressures are low normal.  Discontinue antihypertensives for time being.  She will resume amlodipine on discharge.  Hyperlipidemia: On a statin.  Acute kidney injury with history of chronic kidney disease stage IIIb with mild metabolic acidosis: Renal functions fluctuate.  She is making adequate urine.  She does follow up with nephrology, Dr. Edrick Oh as  outpatient.  Recheck in 1 week to ensure stabilization.  Hypothyroidism: Euthyroid on replacement.  Thrombocytopenia: Cause unknown.  Patient has chronically low platelets, presentation platelets are 120.  Initially dropped and has remained stable.  We will recheck tomorrow morning.  Will need close monitoring to ensure stabilization.  When she goes to a skilled nursing facility, will advise to recheck in 1 week.    DVT prophylaxis: Aspirin 81 mg twice daily. Code Status: Full code Family Communication: Called patient's niece, unable to get through. Disposition Plan: patient is from home. Anticipated DC to skilled nursing rehab, Barriers to discharge, skilled nursing facility availability.   Consultants:   Orthopedics  Procedures:   Left hip hemiarthroplasty, 3/3.  Antimicrobials:  Antibiotics Given (last 72 hours)    Date/Time Action Medication Dose Rate   12/21/19 1359 Given   ceFAZolin (ANCEF) IVPB 2g/100 mL premix 2 g    12/21/19 2025 New Bag/Given   ceFAZolin (ANCEF) IVPB 2g/100 mL premix 2 g 200 mL/hr   12/22/19 0117 New Bag/Given   ceFAZolin (ANCEF) IVPB 2g/100 mL premix 2 g 200 mL/hr         Subjective: Patient seen and examined.  No overnight events.  She was working with physical therapist and was able to walk with assist and with walker.  Pain on ambulating.  Objective: Vitals:   12/22/19 1700 12/22/19 2033 12/23/19 0320 12/23/19 0924  BP: (!) 92/36 (!) 113/40 (!) 105/38 (!) 121/48  Pulse: 71 76 69 79  Resp: 18 16 18 18   Temp: 99.2 F (37.3 C) 99.6 F (37.6 C) 98.5 F (36.9 C) 98.6 F (37 C)  TempSrc: Oral Oral Oral  Oral  SpO2: 96% 98% 100% 98%  Weight:      Height:       No intake or output data in the 24 hours ending 12/23/19 1041 Filed Weights   12/20/19 1346  Weight: 50 kg    Examination:  General exam: Appears calm and comfortable, on room air.  Walked with physical therapy. Respiratory system: Clear to auscultation.  Cardiovascular  system: S1 & S2 heard, RRR.  Gastrointestinal system: Abdomen is nondistended, soft and nontender. No organomegaly or masses felt. Normal bowel sounds heard. Central nervous system: Alert and oriented. No focal neurological deficits. Extremities: Symmetric 5 x 5 power. Left hip.  Incision clean and dry, minimal swelling around the incision. Skin: No rashes, lesions or ulcers Psychiatry: Judgement and insight appear normal. Mood & affect appropriate.     Data Reviewed: I have personally reviewed following labs and imaging studies  CBC: Recent Labs  Lab 12/20/19 1502 12/21/19 0346 12/22/19 0404 12/22/19 1504 12/23/19 0444  WBC 7.1 6.8 7.0  --  5.9  NEUTROABS 6.0  --  4.1  --   --   HGB 11.9* 9.7* 6.8* 8.4* 7.9*  HCT 37.4 30.4* 21.6* 25.0* 23.9*  MCV 96.6 95.9 98.2  --  92.3  PLT 121* 122* 89*  --  86*   Basic Metabolic Panel: Recent Labs  Lab 12/20/19 1502 12/21/19 0346 12/22/19 0404 12/23/19 0444  NA 136 137 137 139  K 4.3 3.8 4.7 4.5  CL 104 107 110 112*  CO2 16* 19* 18* 18*  GLUCOSE 150* 123* 118* 117*  BUN 30* 28* 27* 27*  CREATININE 1.75* 1.72* 1.96* 2.10*  CALCIUM 9.3 8.1* 7.6* 7.5*  PHOS  --   --  4.6  --    GFR: Estimated Creatinine Clearance: 14.3 mL/min (A) (by C-G formula based on SCr of 2.1 mg/dL (H)). Liver Function Tests: Recent Labs  Lab 12/21/19 0346  AST 39  ALT 26  ALKPHOS 66  BILITOT 0.7  PROT 5.2*  ALBUMIN 3.1*   No results for input(s): LIPASE, AMYLASE in the last 168 hours. No results for input(s): AMMONIA in the last 168 hours. Coagulation Profile: Recent Labs  Lab 12/21/19 0346  INR 1.2   Cardiac Enzymes: No results for input(s): CKTOTAL, CKMB, CKMBINDEX, TROPONINI in the last 168 hours. BNP (last 3 results) No results for input(s): PROBNP in the last 8760 hours. HbA1C: No results for input(s): HGBA1C in the last 72 hours. CBG: No results for input(s): GLUCAP in the last 168 hours. Lipid Profile: No results for input(s):  CHOL, HDL, LDLCALC, TRIG, CHOLHDL, LDLDIRECT in the last 72 hours. Thyroid Function Tests: Recent Labs    12/21/19 0346  TSH 4.647*   Anemia Panel: No results for input(s): VITAMINB12, FOLATE, FERRITIN, TIBC, IRON, RETICCTPCT in the last 72 hours. Sepsis Labs: No results for input(s): PROCALCITON, LATICACIDVEN in the last 168 hours.  Recent Results (from the past 240 hour(s))  SARS CORONAVIRUS 2 (TAT 6-24 HRS) Nasopharyngeal Nasopharyngeal Swab     Status: None   Collection Time: 12/20/19  4:11 PM   Specimen: Nasopharyngeal Swab  Result Value Ref Range Status   SARS Coronavirus 2 NEGATIVE NEGATIVE Final    Comment: (NOTE) SARS-CoV-2 target nucleic acids are NOT DETECTED. The SARS-CoV-2 RNA is generally detectable in upper and lower respiratory specimens during the acute phase of infection. Negative results do not preclude SARS-CoV-2 infection, do not rule out co-infections with other pathogens, and should not be used as the sole  basis for treatment or other patient management decisions. Negative results must be combined with clinical observations, patient history, and epidemiological information. The expected result is Negative. Fact Sheet for Patients: SugarRoll.be Fact Sheet for Healthcare Providers: https://www.woods-mathews.com/ This test is not yet approved or cleared by the [place] FDA and  has been authorized for detection and/or diagnosis of SARS-CoV-2 by FDA under an Emergency Use Authorization (EUA). This EUA will remain  in effect (meaning this test can be used) for the duration of the COVID-19 declaration under Section 56 4(b)(1) of the Act, 21 U.S.C. section 360bbb-3(b)(1), unless the authorization is terminated or revoked sooner. Performed at [facility], [place] [address], [place], [place] [address]   Surgical pcr screen     Status: Abnormal   Collection Time: 12/20/19  6:11 PM   Specimen: Nasal Mucosa; Nasal  Swab  Result Value Ref Range Status   MRSA, PCR NEGATIVE NEGATIVE Final   Staphylococcus aureus POSITIVE (A) NEGATIVE Final    Comment: (NOTE) The Xpert SA Assay (FDA approved for NASAL specimens in patients 14 years of age and older), is one component of a comprehensive surveillance program. It is not intended to diagnose infection nor to guide or monitor treatment. Performed at [facility], [place] [address] [place], [place] [address]          Radiology Studies: Pelvis Portable  Result Date: 12/21/2019 CLINICAL DATA:  Left hip replacement EXAM: PORTABLE PELVIS 1-2 VIEWS COMPARISON:  12/20/2019 FINDINGS: Changes of left hip replacement. Normal AP alignment. No hardware bony complicating feature. IMPRESSION: Left hip replacement.  No visible complicating feature. Electronically Signed   By: Rolm Baptise M.D.   On: 12/21/2019 16:35   DG C-Arm 1-60 Min  Result Date: 12/21/2019 CLINICAL DATA:  ORIF left hip EXAM: DG C-ARM 1-60 MIN; OPERATIVE LEFT HIP WITH PELVIS COMPARISON:  12/20/2019 FINDINGS: Changes of left hip replacement. Normal AP alignment. No hardware or bony complicating feature. IMPRESSION: Left hip replacement.  No visible complicating feature. Electronically Signed   By: Rolm Baptise M.D.   On: 12/21/2019 16:33   DG HIP OPERATIVE UNILAT W OR W/O PELVIS LEFT  Result Date: 12/21/2019 CLINICAL DATA:  ORIF left hip EXAM: DG C-ARM 1-60 MIN; OPERATIVE LEFT HIP WITH PELVIS COMPARISON:  12/20/2019 FINDINGS: Changes of left hip replacement. Normal AP alignment. No hardware or bony complicating feature. IMPRESSION: Left hip replacement.  No visible complicating feature. Electronically Signed   By: Rolm Baptise M.D.   On: 12/21/2019 16:33        Scheduled Meds: . sodium chloride   Intravenous Once  . acetaminophen  1,000 mg Oral BID  . aspirin  81 mg Oral BID WC  . atorvastatin  40 mg Oral Daily  . docusate sodium  100 mg Oral BID  . fenofibrate  160 mg Oral Q breakfast  .  levothyroxine  25 mcg Oral Q0600  . mupirocin ointment  1 application Topical BID   Continuous Infusions:    LOS: 3 days    Time spent: 25 minutes    Barb Merino, MD [facility] Pager [phone]

## 2019-12-23 NOTE — Progress Notes (Signed)
Rehab Admissions Coordinator Note:  Patient was screened by Michel Santee for appropriateness for an Inpatient Acute Rehab Consult.  At this time, we are recommending Inpatient Rehab consult. I will place an order per protocol.   Michel Santee 12/23/2019, 12:22 PM  I can be reached at [phone].

## 2019-12-23 NOTE — Plan of Care (Signed)
  Problem: Pain Managment: Goal: General experience of comfort will improve Outcome: Progressing   Problem: Safety: Goal: Ability to remain free from injury will improve Outcome: Progressing   

## 2019-12-24 LAB — BASIC METABOLIC PANEL
Anion gap: 9 (ref 5–15)
BUN: 27 mg/dL — ABNORMAL HIGH (ref 8–23)
CO2: 18 mmol/L — ABNORMAL LOW (ref 22–32)
Calcium: 8.2 mg/dL — ABNORMAL LOW (ref 8.9–10.3)
Chloride: 112 mmol/L — ABNORMAL HIGH (ref 98–111)
Creatinine, Ser: 1.86 mg/dL — ABNORMAL HIGH (ref 0.44–1.00)
GFR calc Af Amer: 28 mL/min — ABNORMAL LOW (ref >60)
GFR calc non Af Amer: 24 mL/min — ABNORMAL LOW (ref >60)
Glucose, Bld: 103 mg/dL — ABNORMAL HIGH (ref 70–99)
Potassium: 4.9 mmol/L (ref 3.5–5.1)
Sodium: 139 mmol/L (ref 135–145)

## 2019-12-24 LAB — CBC
HCT: 24.9 % — ABNORMAL LOW (ref 36.0–46.0)
Hemoglobin: 7.9 g/dL — ABNORMAL LOW (ref 12.0–15.0)
MCH: 29.7 pg (ref 26.0–34.0)
MCHC: 31.7 g/dL (ref 30.0–36.0)
MCV: 93.6 fL (ref 80.0–100.0)
Platelets: 90 10*3/uL — ABNORMAL LOW (ref 150–400)
RBC: 2.66 MIL/uL — ABNORMAL LOW (ref 3.87–5.11)
RDW: 16.6 % — ABNORMAL HIGH (ref 11.5–15.5)
WBC: 6 10*3/uL (ref 4.0–10.5)
nRBC: 0 % (ref 0.0–0.2)

## 2019-12-24 NOTE — Plan of Care (Signed)

## 2019-12-24 NOTE — Progress Notes (Signed)
Occupational Therapy Evaluation Patient Details Name: Diana Villa MRN: [id number] DOB: Apr 07, 1933 Today's Date: 12/24/2019    History of Present Illness 86 yo admitted with hip pain unable to ambulate without report of fall. Pt with left hip fx s/p anterior THA. PMHx: HTN, HLD, CKD, anemia   Clinical Impression   Pt presents with above diagnosis. PTA pt PLOF living at home alone with dog, and has support from family. Mostly I with ADLs and IADLs,  Pt currently limited with safety of functional transfers, pain, and LB dressing and bathing. Pt will benefit from additional acute and skilled OT in CIR to address established deficits to maximize independence prior to dc setting.     Follow Up Recommendations  CIR    Equipment Recommendations  3 in 1 bedside commode    Recommendations for Other Services       Precautions / Restrictions Precautions Precautions: Fall Restrictions Weight Bearing Restrictions: Yes LLE Weight Bearing: Weight bearing as tolerated      Mobility Bed Mobility Overal bed mobility: Needs Assistance Bed Mobility: Supine to Sit     Supine to sit: Mod assist;HOB elevated     General bed mobility comments: assist to manage LLE to EOB and elevate trunk with increased time and cues  Transfers Overall transfer level: Needs assistance   Transfers: Sit to/from Stand Sit to Stand: Min assist         General transfer comment: min assist to power up to stand with cues for hand placement    Balance Overall balance assessment: Needs assistance   Sitting balance-Leahy Scale: Good     Standing balance support: Bilateral upper extremity supported Standing balance-Leahy Scale: Poor                             ADL either performed or assessed with clinical judgement   ADL Overall ADL's : Needs assistance/impaired Eating/Feeding: Independent;Sitting   Grooming: Wash/dry hands;Wash/dry face;Oral care;Set up;Sitting   Upper Body Bathing:  Modified independent;Sitting   Lower Body Bathing: Minimal assistance;Sitting/lateral leans   Upper Body Dressing : Modified independent;Sitting   Lower Body Dressing: Min guard;Minimal assistance;Sitting/lateral leans Lower Body Dressing Details (indicate cue type and reason): Mod I to don/doff RLE, limited with LLE due to limitations to bend forward. Toilet Transfer: Minimal Print production planner Details (indicate cue type and reason): Min A stand pivot to [facility] with RW. Assist to power up to stand with complaints of pain, VC's required for hand placement adn sequencing.  Toileting- Clothing Manipulation and Hygiene: Maximal assistance Toileting - Clothing Manipulation Details (indicate cue type and reason): in standing with RW.     Functional mobility during ADLs: Minimal assistance;Cueing for safety;Cueing for sequencing;Rolling walker       Vision         Perception     Praxis      Pertinent Vitals/Pain Pain Assessment: 0-10 Pain Score: 4  Pain Location: left hip with activity Pain Descriptors / Indicators: Aching;Tender Pain Intervention(s): Limited activity within patient's tolerance;Monitored during session;Repositioned     Hand Dominance     Extremity/Trunk Assessment Upper Extremity Assessment Upper Extremity Assessment: Generalized weakness   Lower Extremity Assessment Lower Extremity Assessment: Defer to PT evaluation LLE Deficits / Details: decreased rOM and strength post op   Cervical / Trunk Assessment Cervical / Trunk Assessment: Kyphotic   Communication Communication Communication: HOH   Cognition Arousal/Alertness: Awake/alert Behavior During Therapy: WFL for tasks assessed/performed Overall Cognitive  Status: Impaired/Different from baseline Area of Impairment: Problem solving                             Problem Solving: Slow processing     General Comments       Exercises General Exercises - Lower Extremity Long Arc  Quad: AAROM;Left;Seated;5 reps Heel Slides: AAROM;Left;Supine;5 reps Hip ABduction/ADduction: AAROM;Left;Supine;5 reps   Shoulder Instructions      Home Living Family/patient expects to be discharged to:: Private residence Living Arrangements: Alone Available Help at Discharge: Family;Available PRN/intermittently Type of Home: House Home Access: Stairs to enter [employer] of Steps: 1   Home Layout: One level     Bathroom Shower/Tub: Teacher, early years/pre: Handicapped height     Home Equipment: Cane - single point;Walker - 2 wheels;Bedside commode          Prior Functioning/Environment Level of Independence: Independent with assistive device(s)        Comments: drives, shops, cook, lives with her dog. Niece lives nearby        OT Problem List: Decreased strength;Pain;Decreased safety awareness;Decreased knowledge of use of DME or AE;Impaired balance (sitting and/or standing);Decreased knowledge of precautions      OT Treatment/Interventions: Self-care/ADL training;Therapeutic exercise;DME and/or AE instruction;Therapeutic activities;Patient/family education;Balance training    OT Goals(Current goals can be found in the care plan section) Acute Rehab OT Goals Patient Stated Goal: return home to my dog OT Goal Formulation: With patient Time For Goal Achievement: 01/07/20 Potential to Achieve Goals: Fair  OT Frequency: Min 2X/week   Barriers to D/C: Decreased caregiver support  Lives alone and has a dog, safety concerns with functional mobility.       Co-evaluation              AM-PAC OT "6 Clicks" Daily Activity     Outcome Measure Help from another person eating meals?: None Help from another person taking care of personal grooming?: A Little Help from another person toileting, which includes using toliet, bedpan, or urinal?: A Lot(set up ) Help from another person bathing (including washing, rinsing, drying)?: A Lot Help from  another person to put on and taking off regular upper body clothing?: None Help from another person to put on and taking off regular lower body clothing?: A Lot 6 Click Score: 17   End of Session Equipment Utilized During Treatment: Gait belt;Rolling walker Nurse Communication: Mobility status;Weight bearing status  Activity Tolerance: Patient limited by pain(with movement) Patient left: in chair;with call bell/phone within reach;with chair alarm set;with nursing/sitter in room  OT Visit Diagnosis: Unsteadiness on feet (R26.81);Muscle weakness (generalized) (M62.81);Pain Pain - Right/Left: Left Pain - part of body: Leg                Time: 5537-4827 OT Time Calculation (min): 19 min Charges:  OT General Charges $OT Visit: 1 Visit OT Evaluation $OT Eval Low Complexity: Robertson, MSOT, OTR/L  Supplemental Rehabilitation Services  [phone]   Marius Ditch 12/24/2019, 1:53 PM

## 2019-12-24 NOTE — Progress Notes (Signed)
PROGRESS NOTE    Diana Villa  ZOX:[id number] DOB: 06-09-1933 DOA: 12/20/2019 PCP: Patient, No Pcp Per    Brief Narrative:  86 year old female with history of hypertension, hyperlipidemia, hypothyroidism, stage III chronic kidney disease, anemia of chronic disease presented to the emergency room with about 2 weeks of pain and difficulty ambulating.  She was treated outpatient with pain medications with no relief.  No particular history of trauma.  In the emergency room, fairly stable.  X-ray of the hip showed left femoral neck fracture.  COVID-19 negative.  Chest x-ray negative for acute disease.  Admitted for surgical intervention.   Assessment & Plan:   Active Problems:   Left displaced femoral neck fracture (HCC)   Essential hypertension   CKD (chronic kidney disease), stage III   Hyperlipidemia   Hypothyroidism  Left femoral neck fracture, closed, spontaneous with trivial trauma: Status post left hip hemiarthroplasty 12/21/19.  Surgically stable as per surgery.  Continue adequate pain medications.  Work with PT OT.  Waiting for acute inpatient rehab availability. Weightbearing as tolerated. Transition to oral pain medications.  Pain controlled with tramadol. Aspirin 81 mg twice a daily as per surgery.  Anemia, acute on chronic with expected blood loss from surgery: Hemoglobin dropped less than 7.  She received 1 unit of PRBC transfusion.  Does have chronic kidney disease and anemia of chronic disease.  She will need close monitoring, advise recheck in 1 week.  Appropriately responded to transfusion.  Essential hypertension: Blood pressures are low normal.  Discontinue antihypertensives for time being.  She will resume amlodipine on discharge.  Hyperlipidemia: On a statin.  Acute kidney injury with history of chronic kidney disease stage IIIb with mild metabolic acidosis: Renal functions fluctuate.  She is making adequate urine.  She does follow up with nephrology, Dr. Edrick Oh as  outpatient.  Recheck in 1 week to ensure stabilization.  Hypothyroidism: Euthyroid on replacement.  Thrombocytopenia: Cause unknown.  Patient has chronically low platelets, presentation platelets are 120.  Initially dropped and has remained stable.     DVT prophylaxis: Aspirin 81 mg twice daily. Code Status: Full code Family Communication: Patient's nephew, on phone. Disposition Plan: patient is from home. Anticipated DC to skilled nursing rehab, Barriers to discharge, acute inpatient rehab availability.   Consultants:   Orthopedics  Procedures:   Left hip hemiarthroplasty, 3/3.  Antimicrobials:  Antibiotics Given (last 72 hours)    Date/Time Action Medication Dose Rate   12/21/19 1359 Given   ceFAZolin (ANCEF) IVPB 2g/100 mL premix 2 g    12/21/19 2025 New Bag/Given   ceFAZolin (ANCEF) IVPB 2g/100 mL premix 2 g 200 mL/hr   12/22/19 0117 New Bag/Given   ceFAZolin (ANCEF) IVPB 2g/100 mL premix 2 g 200 mL/hr         Subjective: No overnight events.  Pain managed with current oral pain medications.  Objective: Vitals:   12/23/19 2012 12/24/19 0501 12/24/19 0748 12/24/19 0800  BP: (!) 138/59 (!) 137/52 (!) 129/49   Pulse: 78 71 85   Resp: 18 16 17    Temp: 98.4 F (36.9 C) 98.8 F (37.1 C) 98.6 F (37 C)   TempSrc: Oral Oral Oral   SpO2: 100% 98% 100%   Weight:    50 kg  Height:    5' (1.524 m)    Intake/Output Summary (Last 24 hours) at 12/24/2019 1123 Last data filed at 12/24/2019 0800 Gross per 24 hour  Intake 360 ml  Output 600 ml  Net -240 ml  Filed Weights   12/20/19 1346 12/24/19 0800  Weight: 50 kg 50 kg    Examination:  General exam: Appears calm and comfortable, on room air.  Walked with physical therapy. Respiratory system: Clear to auscultation.  Cardiovascular system: S1 & S2 heard, RRR.  Gastrointestinal system: Abdomen is nondistended, soft and nontender. No organomegaly or masses felt. Normal bowel sounds heard. Central nervous system:  Alert and oriented. No focal neurological deficits. Extremities: Symmetric 5 x 5 power. Left hip.  Incision clean and dry, minimal swelling around the incision. Skin: No rashes, lesions or ulcers Psychiatry: Judgement and insight appear normal. Mood & affect appropriate.     Data Reviewed: I have personally reviewed following labs and imaging studies  CBC: Recent Labs  Lab 12/20/19 1502 12/20/19 1502 12/21/19 0346 12/22/19 0404 12/22/19 1504 12/23/19 0444 12/24/19 0504  WBC 7.1  --  6.8 7.0  --  5.9 6.0  NEUTROABS 6.0  --   --  4.1  --   --   --   HGB 11.9*   < > 9.7* 6.8* 8.4* 7.9* 7.9*  HCT 37.4   < > 30.4* 21.6* 25.0* 23.9* 24.9*  MCV 96.6  --  95.9 98.2  --  92.3 93.6  PLT 121*  --  122* 89*  --  86* 90*   < > = values in this interval not displayed.   Basic Metabolic Panel: Recent Labs  Lab 12/20/19 1502 12/21/19 0346 12/22/19 0404 12/23/19 0444 12/24/19 0504  NA 136 137 137 139 139  K 4.3 3.8 4.7 4.5 4.9  CL 104 107 110 112* 112*  CO2 16* 19* 18* 18* 18*  GLUCOSE 150* 123* 118* 117* 103*  BUN 30* 28* 27* 27* 27*  CREATININE 1.75* 1.72* 1.96* 2.10* 1.86*  CALCIUM 9.3 8.1* 7.6* 7.5* 8.2*  PHOS  --   --  4.6  --   --    GFR: Estimated Creatinine Clearance: 16.2 mL/min (A) (by C-G formula based on SCr of 1.86 mg/dL (H)). Liver Function Tests: Recent Labs  Lab 12/21/19 0346  AST 39  ALT 26  ALKPHOS 66  BILITOT 0.7  PROT 5.2*  ALBUMIN 3.1*   No results for input(s): LIPASE, AMYLASE in the last 168 hours. No results for input(s): AMMONIA in the last 168 hours. Coagulation Profile: Recent Labs  Lab 12/21/19 0346  INR 1.2   Cardiac Enzymes: No results for input(s): CKTOTAL, CKMB, CKMBINDEX, TROPONINI in the last 168 hours. BNP (last 3 results) No results for input(s): PROBNP in the last 8760 hours. HbA1C: No results for input(s): HGBA1C in the last 72 hours. CBG: No results for input(s): GLUCAP in the last 168 hours. Lipid Profile: No results  for input(s): CHOL, HDL, LDLCALC, TRIG, CHOLHDL, LDLDIRECT in the last 72 hours. Thyroid Function Tests: No results for input(s): TSH, T4TOTAL, FREET4, T3FREE, THYROIDAB in the last 72 hours. Anemia Panel: No results for input(s): VITAMINB12, FOLATE, FERRITIN, TIBC, IRON, RETICCTPCT in the last 72 hours. Sepsis Labs: No results for input(s): PROCALCITON, LATICACIDVEN in the last 168 hours.  Recent Results (from the past 240 hour(s))  SARS CORONAVIRUS 2 (TAT 6-24 HRS) Nasopharyngeal Nasopharyngeal Swab     Status: None   Collection Time: 12/20/19  4:11 PM   Specimen: Nasopharyngeal Swab  Result Value Ref Range Status   SARS Coronavirus 2 NEGATIVE NEGATIVE Final    Comment: (NOTE) SARS-CoV-2 target nucleic acids are NOT DETECTED. The SARS-CoV-2 RNA is generally detectable in upper and lower respiratory specimens  during the acute phase of infection. Negative results do not preclude SARS-CoV-2 infection, do not rule out co-infections with other pathogens, and should not be used as the sole basis for treatment or other patient management decisions. Negative results must be combined with clinical observations, patient history, and epidemiological information. The expected result is Negative. Fact Sheet for Patients: SugarRoll.be Fact Sheet for Healthcare Providers: https://www.woods-mathews.com/ This test is not yet approved or cleared by the [place] FDA and  has been authorized for detection and/or diagnosis of SARS-CoV-2 by FDA under an Emergency Use Authorization (EUA). This EUA will remain  in effect (meaning this test can be used) for the duration of the COVID-19 declaration under Section 56 4(b)(1) of the Act, 21 U.S.C. section 360bbb-3(b)(1), unless the authorization is terminated or revoked sooner. Performed at [facility], [place] [address] [place], [place] [address]   Surgical pcr screen     Status: Abnormal   Collection  Time: 12/20/19  6:11 PM   Specimen: Nasal Mucosa; Nasal Swab  Result Value Ref Range Status   MRSA, PCR NEGATIVE NEGATIVE Final   Staphylococcus aureus POSITIVE (A) NEGATIVE Final    Comment: (NOTE) The Xpert SA Assay (FDA approved for NASAL specimens in patients 27 years of age and older), is one component of a comprehensive surveillance program. It is not intended to diagnose infection nor to guide or monitor treatment. Performed at [facility], [place] [address], [place], [place] [address]          Radiology Studies: No results found.      Scheduled Meds: . sodium chloride   Intravenous Once  . acetaminophen  1,000 mg Oral BID  . aspirin  81 mg Oral BID WC  . atorvastatin  40 mg Oral Daily  . docusate sodium  100 mg Oral BID  . fenofibrate  160 mg Oral Q breakfast  . levothyroxine  25 mcg Oral Q0600  . mupirocin ointment  1 application Topical BID   Continuous Infusions:    LOS: 4 days    Time spent: 25 minutes    Barb Merino, MD [facility] Pager [phone]

## 2019-12-25 LAB — BASIC METABOLIC PANEL
Anion gap: 11 (ref 5–15)
BUN: 28 mg/dL — ABNORMAL HIGH (ref 8–23)
CO2: 20 mmol/L — ABNORMAL LOW (ref 22–32)
Calcium: 8.9 mg/dL (ref 8.9–10.3)
Chloride: 109 mmol/L (ref 98–111)
Creatinine, Ser: 1.73 mg/dL — ABNORMAL HIGH (ref 0.44–1.00)
GFR calc Af Amer: 31 mL/min — ABNORMAL LOW (ref >60)
GFR calc non Af Amer: 27 mL/min — ABNORMAL LOW (ref >60)
Glucose, Bld: 89 mg/dL (ref 70–99)
Potassium: 4.9 mmol/L (ref 3.5–5.1)
Sodium: 140 mmol/L (ref 135–145)

## 2019-12-25 NOTE — Plan of Care (Signed)
  Problem: Education: Goal: Knowledge of General Education information will improve Description: Including pain rating scale, medication(s)/side effects and non-pharmacologic comfort measures Outcome: Progressing   Problem: Activity: Goal: Risk for activity intolerance will decrease Outcome: Progressing   Problem: Nutrition: Goal: Adequate nutrition will be maintained Outcome: Progressing   Problem: Coping: Goal: Level of anxiety will decrease Outcome: Progressing   

## 2019-12-25 NOTE — Plan of Care (Signed)

## 2019-12-25 NOTE — Progress Notes (Signed)
PROGRESS NOTE    Diana Villa  VWU:[id number] DOB: 11-16-1932 DOA: 12/20/2019 PCP: Patient, No Pcp Per    Brief Narrative:  86 year old female with history of hypertension, hyperlipidemia, hypothyroidism, stage III chronic kidney disease, anemia of chronic disease presented to the emergency room with about 2 weeks of pain and difficulty ambulating.  She was treated outpatient with pain medications with no relief.  No particular history of trauma.  In the emergency room, fairly stable.  X-ray of the hip showed left femoral neck fracture.  COVID-19 negative.  Chest x-ray negative for acute disease.  Admitted for surgical intervention.   Assessment & Plan:   Active Problems:   Left displaced femoral neck fracture (HCC)   Essential hypertension   CKD (chronic kidney disease), stage III   Hyperlipidemia   Hypothyroidism  Left femoral neck fracture, closed, spontaneous with trivial trauma: Status post left hip hemiarthroplasty 12/21/19.  Surgically stable as per surgery.  Continue adequate pain medications.  Work with PT OT.  Waiting for acute inpatient rehab availability. Weightbearing as tolerated. Transition to oral pain medications.  Pain controlled with tramadol. Aspirin 81 mg twice a daily as per surgery.  Anemia, acute on chronic with expected blood loss from surgery: Hemoglobin dropped less than 7.  She received 1 unit of PRBC transfusion.  Does have chronic kidney disease and anemia of chronic disease.  She will need close monitoring, advise recheck in 1 week.  Appropriately responded to transfusion.  Essential hypertension: Blood pressures are low normal.  Discontinue antihypertensives for time being.  She will resume amlodipine on discharge.  Hyperlipidemia: On a statin.  Acute kidney injury with history of chronic kidney disease stage IIIb with mild metabolic acidosis: Renal functions fluctuate.  She is making adequate urine.  She does follow up with nephrology, Dr. Edrick Oh as  outpatient.  Recheck in 1 week to ensure stabilization.  Hypothyroidism: Euthyroid on replacement.  Thrombocytopenia: Cause unknown.  Patient has chronically low platelets, presentation platelets are 120.  Initially dropped and has remained stable.  Will need outpatient monitoring.    DVT prophylaxis: Aspirin 81 mg twice daily. Code Status: Full code Family Communication: Patient's nephew, on phone 3/6 Disposition Plan: patient is from home. Anticipated DC to skilled nursing rehab, Barriers to discharge, acute inpatient rehab availability.   Consultants:   Orthopedics  Procedures:   Left hip hemiarthroplasty, 3/3.  Antimicrobials:  Antibiotics Given (last 72 hours)    None         Subjective: No change in condition.  Waiting for ER.  Objective: Vitals:   12/24/19 0800 12/24/19 1926 12/25/19 0500 12/25/19 1006  BP:  (!) 119/42 (!) 134/50 (!) 104/50  Pulse:  81 67 72  Resp:  16 16   Temp:  99.7 F (37.6 C) 98.8 F (37.1 C) 98.8 F (37.1 C)  TempSrc:  Oral Oral Oral  SpO2:  98% 100% 97%  Weight: 50 kg     Height: 5' (1.524 m)      No intake or output data in the 24 hours ending 12/25/19 1110 Filed Weights   12/20/19 1346 12/24/19 0800  Weight: 50 kg 50 kg    Examination:  General exam: Appears calm and comfortable, on room air.  Walked with physical therapy. Respiratory system: Clear to auscultation.  Cardiovascular system: S1 & S2 heard, RRR.  Gastrointestinal system: Abdomen is nondistended, soft and nontender. No organomegaly or masses felt. Normal bowel sounds heard. Central nervous system: Alert and oriented. No focal neurological  deficits. Extremities: Symmetric 5 x 5 power. Left hip.  Incision clean and dry, minimal swelling around the incision. Skin: No rashes, lesions or ulcers Psychiatry: Judgement and insight appear normal. Mood & affect appropriate.     Data Reviewed: I have personally reviewed following labs and imaging  studies  CBC: Recent Labs  Lab 12/20/19 1502 12/20/19 1502 12/21/19 0346 12/22/19 0404 12/22/19 1504 12/23/19 0444 12/24/19 0504  WBC 7.1  --  6.8 7.0  --  5.9 6.0  NEUTROABS 6.0  --   --  4.1  --   --   --   HGB 11.9*   < > 9.7* 6.8* 8.4* 7.9* 7.9*  HCT 37.4   < > 30.4* 21.6* 25.0* 23.9* 24.9*  MCV 96.6  --  95.9 98.2  --  92.3 93.6  PLT 121*  --  122* 89*  --  86* 90*   < > = values in this interval not displayed.   Basic Metabolic Panel: Recent Labs  Lab 12/21/19 0346 12/22/19 0404 12/23/19 0444 12/24/19 0504 12/25/19 0402  NA 137 137 139 139 140  K 3.8 4.7 4.5 4.9 4.9  CL 107 110 112* 112* 109  CO2 19* 18* 18* 18* 20*  GLUCOSE 123* 118* 117* 103* 89  BUN 28* 27* 27* 27* 28*  CREATININE 1.72* 1.96* 2.10* 1.86* 1.73*  CALCIUM 8.1* 7.6* 7.5* 8.2* 8.9  PHOS  --  4.6  --   --   --    GFR: Estimated Creatinine Clearance: 17.4 mL/min (A) (by C-G formula based on SCr of 1.73 mg/dL (H)). Liver Function Tests: Recent Labs  Lab 12/21/19 0346  AST 39  ALT 26  ALKPHOS 66  BILITOT 0.7  PROT 5.2*  ALBUMIN 3.1*   No results for input(s): LIPASE, AMYLASE in the last 168 hours. No results for input(s): AMMONIA in the last 168 hours. Coagulation Profile: Recent Labs  Lab 12/21/19 0346  INR 1.2   Cardiac Enzymes: No results for input(s): CKTOTAL, CKMB, CKMBINDEX, TROPONINI in the last 168 hours. BNP (last 3 results) No results for input(s): PROBNP in the last 8760 hours. HbA1C: No results for input(s): HGBA1C in the last 72 hours. CBG: No results for input(s): GLUCAP in the last 168 hours. Lipid Profile: No results for input(s): CHOL, HDL, LDLCALC, TRIG, CHOLHDL, LDLDIRECT in the last 72 hours. Thyroid Function Tests: No results for input(s): TSH, T4TOTAL, FREET4, T3FREE, THYROIDAB in the last 72 hours. Anemia Panel: No results for input(s): VITAMINB12, FOLATE, FERRITIN, TIBC, IRON, RETICCTPCT in the last 72 hours. Sepsis Labs: No results for input(s):  PROCALCITON, LATICACIDVEN in the last 168 hours.  Recent Results (from the past 240 hour(s))  SARS CORONAVIRUS 2 (TAT 6-24 HRS) Nasopharyngeal Nasopharyngeal Swab     Status: None   Collection Time: 12/20/19  4:11 PM   Specimen: Nasopharyngeal Swab  Result Value Ref Range Status   SARS Coronavirus 2 NEGATIVE NEGATIVE Final    Comment: (NOTE) SARS-CoV-2 target nucleic acids are NOT DETECTED. The SARS-CoV-2 RNA is generally detectable in upper and lower respiratory specimens during the acute phase of infection. Negative results do not preclude SARS-CoV-2 infection, do not rule out co-infections with other pathogens, and should not be used as the sole basis for treatment or other patient management decisions. Negative results must be combined with clinical observations, patient history, and epidemiological information. The expected result is Negative. Fact Sheet for Patients: SugarRoll.be Fact Sheet for Healthcare Providers: https://www.woods-mathews.com/ This test is not yet approved or  cleared by the [place] and  has been authorized for detection and/or diagnosis of SARS-CoV-2 by FDA under an Emergency Use Authorization (EUA). This EUA will remain  in effect (meaning this test can be used) for the duration of the COVID-19 declaration under Section 56 4(b)(1) of the Act, 21 U.S.C. section 360bbb-3(b)(1), unless the authorization is terminated or revoked sooner. Performed at [facility], [place] [address], [place], [place] [address]   Surgical pcr screen     Status: Abnormal   Collection Time: 12/20/19  6:11 PM   Specimen: Nasal Mucosa; Nasal Swab  Result Value Ref Range Status   MRSA, PCR NEGATIVE NEGATIVE Final   Staphylococcus aureus POSITIVE (A) NEGATIVE Final    Comment: (NOTE) The Xpert SA Assay (FDA approved for NASAL specimens in patients 33 years of age and older), is one component of a comprehensive surveillance  program. It is not intended to diagnose infection nor to guide or monitor treatment. Performed at [facility], [place] [address], [place],  [address]          Radiology Studies: No results found.      Scheduled Meds: . sodium chloride   Intravenous Once  . acetaminophen  1,000 mg Oral BID  . aspirin  81 mg Oral BID WC  . atorvastatin  40 mg Oral Daily  . docusate sodium  100 mg Oral BID  . fenofibrate  160 mg Oral Q breakfast  . levothyroxine  25 mcg Oral Q0600  . mupirocin ointment  1 application Topical BID   Continuous Infusions:    LOS: 5 days    Time spent: 25 minutes    Barb Merino, MD [facility] Pager [phone]

## 2019-12-25 NOTE — Progress Notes (Signed)
Physical Therapy Treatment Patient Details Name: Diana Villa MRN: [id number] DOB: 07-14-1933 Today's Date: 12/25/2019    History of Present Illness 86 yo admitted with hip pain unable to ambulate without report of fall. Pt with left hip fx s/p anterior THA. PMHx: HTN, HLD, CKD, anemia    PT Comments    Continuing work on functional mobility and activity tolerance;  Lots of encouragement to get up and walk; Agreeable to walk to bathroom; Cues for gait sequence, and use of RW to unwiegh painful LLE in stance;  Continue to recommend comprehensive inpatient rehab (CIR) for post-acute therapy needs.   Follow Up Recommendations  CIR;Supervision/Assistance - 24 hour(SNF if CIR declines)     Equipment Recommendations  3in1 (PT)    Recommendations for Other Services OT consult;Rehab consult     Precautions / Restrictions Precautions Precautions: Fall Restrictions LLE Weight Bearing: Weight bearing as tolerated    Mobility  Bed Mobility Overal bed mobility: Needs Assistance Bed Mobility: Supine to Sit     Supine to sit: Mod assist;HOB elevated     General bed mobility comments: assist to manage LLE to EOB and elevate trunk with increased time and cues  Transfers Overall transfer level: Needs assistance Equipment used: Rolling walker (2 wheeled) Transfers: Sit to/from Stand Sit to Stand: Mod assist         General transfer comment: light mod assist to power up from bed and tiolet; cues for hand position and safety  Ambulation/Gait Ambulation/Gait assistance: Min assist Gait Distance (Feet): 35 Feet Assistive device: Rolling walker (2 wheeled) Gait Pattern/deviations: Step-to pattern;Decreased stride length;Trunk flexed     General Gait Details: cues for posture and proximity to RW with assist to maintain control of RW, step to pattern with left stance limited   Stairs             Wheelchair Mobility    Modified Rankin (Stroke Patients Only)        Balance     Sitting balance-Leahy Scale: Good       Standing balance-Leahy Scale: Poor                              Cognition Arousal/Alertness: Awake/alert Behavior During Therapy: WFL for tasks assessed/performed Overall Cognitive Status: Impaired/Different from baseline Area of Impairment: Problem solving                                      Exercises      General Comments        Pertinent Vitals/Pain Pain Assessment: 0-10 Pain Score: 4  Pain Location: left hip with activity Pain Descriptors / Indicators: Aching;Tender Pain Intervention(s): Monitored during session;Patient requesting pain meds-RN notified    Home Living                      Prior Function            PT Goals (current goals can now be found in the care plan section) Acute Rehab PT Goals Patient Stated Goal: return home to my dog PT Goal Formulation: With patient Time For Goal Achievement: 01/06/20 Potential to Achieve Goals: Good Progress towards PT goals: Progressing toward goals    Frequency    Min 5X/week      PT Plan Current plan remains appropriate    Co-evaluation  AM-PAC PT "6 Clicks" Mobility   Outcome Measure  Help needed turning from your back to your side while in a flat bed without using bedrails?: A Little Help needed moving from lying on your back to sitting on the side of a flat bed without using bedrails?: A Little Help needed moving to and from a bed to a chair (including a wheelchair)?: A Little Help needed standing up from a chair using your arms (e.g., wheelchair or bedside chair)?: A Little Help needed to walk in hospital room?: A Little Help needed climbing 3-5 steps with a railing? : A Lot 6 Click Score: 17    End of Session Equipment Utilized During Treatment: Gait belt Activity Tolerance: Patient tolerated treatment well Patient left: in chair;with call bell/phone within reach;with chair alarm  set Nurse Communication: Mobility status;Weight bearing status PT Visit Diagnosis: Other abnormalities of gait and mobility (R26.89);Difficulty in walking, not elsewhere classified (R26.2);History of falling (Z91.81);Muscle weakness (generalized) (M62.81)     Time: 1497-0263 PT Time Calculation (min) (ACUTE ONLY): 20 min  Charges:  $Gait Training: 8-22 mins                     Roney Marion, [place]  Acute Rehabilitation Services Pager [phone] Office [place] 12/25/2019, 5:26 PM

## 2019-12-26 LAB — SARS CORONAVIRUS 2 (TAT 6-24 HRS): SARS Coronavirus 2: NEGATIVE

## 2019-12-26 MED ORDER — AMLODIPINE BESYLATE 5 MG PO TABS
5.0000 mg | ORAL_TABLET | Freq: Every day | ORAL | Status: DC
  Administered 2019-12-26 – 2019-12-27 (×2): 5 mg via ORAL
  Filled 2019-12-26: qty 1

## 2019-12-26 NOTE — Progress Notes (Signed)
PROGRESS NOTE    Diana Villa  GEZ:[id number] DOB: 21-May-1933 DOA: 12/20/2019 PCP: Patient, No Pcp Per    Brief Narrative:  86 year old female with history of hypertension, hyperlipidemia, hypothyroidism, stage III chronic kidney disease, anemia of chronic disease presented to the emergency room with about 2 weeks of pain and difficulty ambulating.  She was treated outpatient with pain medications with no relief.  No particular history of trauma.  In the emergency room, fairly stable.  X-ray of the hip showed left femoral neck fracture.  COVID-19 negative.  Chest x-ray negative for acute disease.  Admitted for surgical intervention.   Assessment & Plan:   Active Problems:   Left displaced femoral neck fracture (HCC)   Essential hypertension   CKD (chronic kidney disease), stage III   Hyperlipidemia   Hypothyroidism  Left femoral neck fracture, closed, spontaneous with trivial trauma: Status post left hip hemiarthroplasty 12/21/19.  Surgically stable as per surgery.  Pain managed with oral pain medications. Declined by [facility].  Going to SNF.  Possible admission tomorrow. Weightbearing as tolerated. Transition to oral pain medications.  Pain controlled with tramadol. Aspirin 81 mg twice a daily as per surgery.  Anemia, acute on chronic with expected blood loss from surgery: Hemoglobin dropped less than 7.  She received 1 unit of PRBC transfusion.  Does have chronic kidney disease and anemia of chronic disease.  She will need close monitoring, advise recheck in 1 week.  Appropriately responded to transfusion.  Stable.  Essential hypertension: Blood pressure is picking up.  Start amlodipine.  Hyperlipidemia: On a statin.  Acute kidney injury with history of chronic kidney disease stage IIIb with mild metabolic acidosis: Renal functions fluctuate.  She is making adequate urine.  She does follow up with nephrology, Dr. Edrick Oh as outpatient.  Recheck in 1 week to ensure  stabilization.  Hypothyroidism: Euthyroid on replacement.  Thrombocytopenia: Cause unknown.  Patient has chronically low platelets, presentation platelets are 120.  Initially dropped and has remained stable.  Will need outpatient monitoring.    DVT prophylaxis: Aspirin 81 mg twice daily. Code Status: Full code Family Communication: Patient's nephew, on phone 3/6 Disposition Plan: patient is from home. Anticipated DC to skilled nursing rehab, Barriers to discharge, They are able to admit her tomorrow, 3/9. COVID-19 swab in anticipation for admission.   Consultants:   Orthopedics  Procedures:   Left hip hemiarthroplasty, 3/3.  Antimicrobials:  Antibiotics Given (last 72 hours)    None         Subjective: No change in condition.  Pain managed.  Some pain in the hip while walking with therapies.  Objective: Vitals:   12/25/19 1235 12/25/19 2100 12/26/19 0500 12/26/19 0819  BP: (!) 123/47 (!) 125/48 (!) 130/58 (!) 172/82  Pulse: 78 74 62 97  Resp:    19  Temp: 98.8 F (37.1 C) 98.5 F (36.9 C) 98.2 F (36.8 C) 98.5 F (36.9 C)  TempSrc: Oral Oral Oral   SpO2: 97% 99% 98% 100%  Weight:      Height:        Intake/Output Summary (Last 24 hours) at 12/26/2019 1332 Last data filed at 12/26/2019 0930 Gross per 24 hour  Intake 240 ml  Output --  Net 240 ml   Filed Weights   12/20/19 1346 12/24/19 0800  Weight: 50 kg 50 kg    Examination:  General exam: Appears calm and comfortable, on room air. Respiratory system: Clear to auscultation.  Cardiovascular system: S1 & S2 heard,  RRR.  Gastrointestinal system: Abdomen is nondistended, soft and nontender. No organomegaly or masses felt. Normal bowel sounds heard. Central nervous system: Alert and oriented. No focal neurological deficits. Extremities: Symmetric 5 x 5 power. Left hip.  Incision clean and dry, Skin: No rashes, lesions or ulcers Psychiatry: Judgement and insight appear normal. Mood & affect appropriate.      Data Reviewed: I have personally reviewed following labs and imaging studies  CBC: Recent Labs  Lab 12/20/19 1502 12/20/19 1502 12/21/19 0346 12/22/19 0404 12/22/19 1504 12/23/19 0444 12/24/19 0504  WBC 7.1  --  6.8 7.0  --  5.9 6.0  NEUTROABS 6.0  --   --  4.1  --   --   --   HGB 11.9*   < > 9.7* 6.8* 8.4* 7.9* 7.9*  HCT 37.4   < > 30.4* 21.6* 25.0* 23.9* 24.9*  MCV 96.6  --  95.9 98.2  --  92.3 93.6  PLT 121*  --  122* 89*  --  86* 90*   < > = values in this interval not displayed.   Basic Metabolic Panel: Recent Labs  Lab 12/21/19 0346 12/22/19 0404 12/23/19 0444 12/24/19 0504 12/25/19 0402  NA 137 137 139 139 140  K 3.8 4.7 4.5 4.9 4.9  CL 107 110 112* 112* 109  CO2 19* 18* 18* 18* 20*  GLUCOSE 123* 118* 117* 103* 89  BUN 28* 27* 27* 27* 28*  CREATININE 1.72* 1.96* 2.10* 1.86* 1.73*  CALCIUM 8.1* 7.6* 7.5* 8.2* 8.9  PHOS  --  4.6  --   --   --    GFR: Estimated Creatinine Clearance: 17.4 mL/min (A) (by C-G formula based on SCr of 1.73 mg/dL (H)). Liver Function Tests: Recent Labs  Lab 12/21/19 0346  AST 39  ALT 26  ALKPHOS 66  BILITOT 0.7  PROT 5.2*  ALBUMIN 3.1*   No results for input(s): LIPASE, AMYLASE in the last 168 hours. No results for input(s): AMMONIA in the last 168 hours. Coagulation Profile: Recent Labs  Lab 12/21/19 0346  INR 1.2   Cardiac Enzymes: No results for input(s): CKTOTAL, CKMB, CKMBINDEX, TROPONINI in the last 168 hours. BNP (last 3 results) No results for input(s): PROBNP in the last 8760 hours. HbA1C: No results for input(s): HGBA1C in the last 72 hours. CBG: No results for input(s): GLUCAP in the last 168 hours. Lipid Profile: No results for input(s): CHOL, HDL, LDLCALC, TRIG, CHOLHDL, LDLDIRECT in the last 72 hours. Thyroid Function Tests: No results for input(s): TSH, T4TOTAL, FREET4, T3FREE, THYROIDAB in the last 72 hours. Anemia Panel: No results for input(s): VITAMINB12, FOLATE, FERRITIN, TIBC, IRON,  RETICCTPCT in the last 72 hours. Sepsis Labs: No results for input(s): PROCALCITON, LATICACIDVEN in the last 168 hours.  Recent Results (from the past 240 hour(s))  SARS CORONAVIRUS 2 (TAT 6-24 HRS) Nasopharyngeal Nasopharyngeal Swab     Status: None   Collection Time: 12/20/19  4:11 PM   Specimen: Nasopharyngeal Swab  Result Value Ref Range Status   SARS Coronavirus 2 NEGATIVE NEGATIVE Final    Comment: (NOTE) SARS-CoV-2 target nucleic acids are NOT DETECTED. The SARS-CoV-2 RNA is generally detectable in upper and lower respiratory specimens during the acute phase of infection. Negative results do not preclude SARS-CoV-2 infection, do not rule out co-infections with other pathogens, and should not be used as the sole basis for treatment or other patient management decisions. Negative results must be combined with clinical observations, patient history, and epidemiological information.  The expected result is Negative. Fact Sheet for Patients: SugarRoll.be Fact Sheet for Healthcare Providers: https://www.woods-mathews.com/ This test is not yet approved or cleared by the [place] FDA and  has been authorized for detection and/or diagnosis of SARS-CoV-2 by FDA under an Emergency Use Authorization (EUA). This EUA will remain  in effect (meaning this test can be used) for the duration of the COVID-19 declaration under Section 56 4(b)(1) of the Act, 21 U.S.C. section 360bbb-3(b)(1), unless the authorization is terminated or revoked sooner. Performed at [facility], [place] [address], [place], [place] [address]   Surgical pcr screen     Status: Abnormal   Collection Time: 12/20/19  6:11 PM   Specimen: Nasal Mucosa; Nasal Swab  Result Value Ref Range Status   MRSA, PCR NEGATIVE NEGATIVE Final   Staphylococcus aureus POSITIVE (A) NEGATIVE Final    Comment: (NOTE) The Xpert SA Assay (FDA approved for NASAL specimens in patients 50 years  of age and older), is one component of a comprehensive surveillance program. It is not intended to diagnose infection nor to guide or monitor treatment. Performed at [facility], [place] [address] [place], [place] [address]          Radiology Studies: No results found.      Scheduled Meds: . sodium chloride   Intravenous Once  . acetaminophen  1,000 mg Oral BID  . amLODipine  5 mg Oral Daily  . aspirin  81 mg Oral BID WC  . atorvastatin  40 mg Oral Daily  . docusate sodium  100 mg Oral BID  . fenofibrate  160 mg Oral Q breakfast  . levothyroxine  25 mcg Oral Q0600   Continuous Infusions:    LOS: 6 days    Time spent: 25 minutes    Barb Merino, MD [facility] Pager [phone]

## 2019-12-26 NOTE — TOC Initial Note (Addendum)
Transition of Care [facility]) - Initial/Assessment Note    Patient Details  Name: Diana Villa MRN: [id number] Date of Birth: Jun 12, 1933  Transition of [facility]) CM/SW Contact:    Sharin Mons, RN Phone Number: 12/26/2019, 12:50 PM  Clinical Narrative:    Admitted with L hip fx, s/p anterior THA. PMHx: HTN, HLD, CKD, anemia.     Pt declined for CIR.          NCM received consult for possible SNF placement at time of discharge. NCM spoke with patient @ bedside and pt's nephew Robah [facility]ra [facility]) via phone regarding PT recommendation of SNF placement at time of discharge. Patient reported that she is currently unable to care for self at  home given her current physical needs and fall risk. Patient expressed understanding of PT recommendation and is agreeable to SNF placement at time of discharge. Patient reports preference for [facility]. NCM discussed insurance authorization process and provided Medicare SNF ratings list. Patient expressed being hopeful for rehab and to feel better soon. No further questions reported at this time. RNCM to continue to follow and assist with discharge planning needs.  Nicki Reaper [place])     [phone]      SNF work up completed. NCM spoke with admission liaison @ [facility] regarding bed offer. Liaison extended bed offer for tomorrow. Updated COVID needed. MD and nurse made aware.  Expected Discharge Plan: Skilled Nursing Facility Barriers to Discharge: Other (comment)(Insurance authorization, updated COVID)   Patient Goals and CMS Choice   CMS Medicare.gov Compare Post Acute Care list provided to:: Patient    Expected Discharge Plan and Services Expected Discharge Plan: [place]   Discharge Planning Services: CM Consult   Living arrangements for the past 2 months: Single Family Home      Prior Living Arrangements/Services Living arrangements for the past 2 months: Single Family Home Lives with::  Self                   Activities of Daily Living Home Assistive Devices/Equipment: Cane (specify quad or straight), Dentures (specify type), Eyeglasses, Walker (specify type), Bedside commode/3-in-1 ADL Screening (condition at time of admission) Patient's cognitive ability adequate to safely complete daily activities?: Yes Is the patient deaf or have difficulty hearing?: No Does the patient have difficulty seeing, even when wearing glasses/contacts?: No Does the patient have difficulty concentrating, remembering, or making decisions?: No Patient able to express need for assistance with ADLs?: Yes Does the patient have difficulty dressing or bathing?: No Independently performs ADLs?: Yes (appropriate for developmental age) Does the patient have difficulty walking or climbing stairs?: Yes Weakness of Legs: Left Weakness of Arms/Hands: None  Permission Sought/Granted                  Emotional Assessment              Admission diagnosis:  Hip fracture ([place]) [S72.009A] Left displaced femoral neck fracture ([place]) [S72.002A] Closed fracture of neck of left femur, initial encounter ([place]) [S72.002A] Patient Active Problem List   Diagnosis Date Noted  . Left displaced femoral neck fracture ([place]) 12/20/2019  . Essential hypertension 12/20/2019  . CKD (chronic kidney disease), stage III 12/20/2019  . Hyperlipidemia 12/20/2019  . Hypothyroidism 12/20/2019  . Anemia of chronic disease 05/15/2016   PCP:  Patient, No Pcp Per Pharmacy:   [facility] [place] [facility] [address] [place] [address] Phone: [phone]  Fax: [phone]  [id number] [place], [place] [address] Phone: [phone] Fax: [phone]     Social Determinants of Health (SDOH) Interventions    Readmission Risk Interventions No flowsheet data found.

## 2019-12-26 NOTE — Plan of Care (Signed)

## 2019-12-26 NOTE — NC FL2 (Signed)
[place] LEVEL OF CARE SCREENING TOOL     IDENTIFICATION  Patient Name: Diana Villa Birthdate: 1933-06-17 Sex: female Admission Date (Current Location): 12/20/2019  [facility] and [place] Number:  Herbalist and Address:  [facility] [place], [place] [address], [place], [place] [address]      Provider Number: [id number]  Attending Physician Name and Address:  Barb Merino, MD  Relative Name and Phone Number:  Nicki Reaper [facility]) [phone]    Current Level of Care: Hospital Recommended Level of Care: [place] Prior Approval Number:    Date Approved/Denied:   PASRR Number: [id number]  Discharge Plan: SNF    Current Diagnoses: Patient Active Problem List   Diagnosis Date Noted  . Left displaced femoral neck fracture ([place]) 12/20/2019  . Essential hypertension 12/20/2019  . CKD (chronic kidney disease), stage III 12/20/2019  . Hyperlipidemia 12/20/2019  . Hypothyroidism 12/20/2019  . Anemia of chronic disease 05/15/2016    Orientation RESPIRATION BLADDER Height & Weight     Self, Time, Situation, Place  Normal Continent Weight: 50 kg Height:  5' (152.4 cm)  BEHAVIORAL SYMPTOMS/MOOD NEUROLOGICAL BOWEL NUTRITION STATUS      Continent Diet(REFER TO D/C SUMMARY)  AMBULATORY STATUS COMMUNICATION OF NEEDS Skin   Extensive Assist Verbally Normal, Other (Comment)(3/3 s/p L THA)                       Personal Care Assistance Level of Assistance  Bathing, Feeding, Dressing Bathing Assistance: Maximum assistance Feeding assistance: Independent Dressing Assistance: Maximum assistance     Functional Limitations Info  Sight, Hearing, Speech Sight Info: Impaired Hearing Info: Impaired(HOH) Speech Info: Adequate    SPECIAL CARE FACTORS FREQUENCY  PT (By licensed PT), OT (By licensed OT)     PT Frequency: 5X /week, evaluate and treat OT Frequency: 5X /week, evaluate and treat            Contractures  Contractures Info: Not present    Additional Factors Info  Code Status, Allergies Code Status Info: full code Allergies Info: Codeine           Current Medications (12/26/2019):  This is the current hospital active medication list Current Facility-Administered Medications  Medication Dose Route Frequency Provider Last Rate Last Admin  . 0.9 %  sodium chloride infusion (Manually program via Guardrails IV Fluids)   Intravenous Once Kirby-Graham, Karsten Fells, NP      . acetaminophen (TYLENOL) tablet 1,000 mg  1,000 mg Oral BID Rod Can, MD   1,000 mg at 12/26/19 0903  . amLODipine (NORVASC) tablet 5 mg  5 mg Oral Daily Barb Merino, MD   5 mg at 12/26/19 1000  . aspirin chewable tablet 81 mg  81 mg Oral BID WC Rod Can, MD   81 mg at 12/26/19 0800  . atorvastatin (LIPITOR) tablet 40 mg  40 mg Oral Daily Rod Can, MD   40 mg at 12/26/19 1000  . docusate sodium (COLACE) capsule 100 mg  100 mg Oral BID Rod Can, MD   100 mg at 12/26/19 1000  . fenofibrate tablet 160 mg  160 mg Oral Q breakfast Rod Can, MD   160 mg at 12/26/19 0855  . levothyroxine (SYNTHROID) tablet 25 mcg  25 mcg Oral Q0600 Rod Can, MD   25 mcg at 12/26/19 0500  . menthol-cetylpyridinium (CEPACOL) lozenge 3 mg  1 lozenge Oral PRN Rod Can, MD  Or  . phenol (CHLORASEPTIC) mouth spray 1 spray  1 spray Mouth/Throat PRN Swinteck, Aaron Edelman, MD      . metoCLOPramide (REGLAN) tablet 5-10 mg  5-10 mg Oral Q8H PRN Swinteck, Aaron Edelman, MD       Or  . metoCLOPramide (REGLAN) injection 5-10 mg  5-10 mg Intravenous Q8H PRN Swinteck, Aaron Edelman, MD      . morphine 2 MG/ML injection 1-2 mg  1-2 mg Intravenous Q4H PRN Rod Can, MD   1 mg at 12/25/19 2055  . ondansetron (ZOFRAN) tablet 4 mg  4 mg Oral Q6H PRN Swinteck, Aaron Edelman, MD       Or  . ondansetron (ZOFRAN) injection 4 mg  4 mg Intravenous Q6H PRN Swinteck, Aaron Edelman, MD      . traMADol Veatrice Bourbon) tablet 50 mg  50 mg Oral Q6H PRN Rod Can, MD   50 mg at 12/25/19 1649     Discharge Medications: Please see discharge summary for a list of discharge medications.  Relevant Imaging Results:  Relevant Lab Results:   Additional Information ss# [id number]  Sharin Mons, RN

## 2019-12-26 NOTE — Progress Notes (Signed)
Occupational Therapy Treatment Patient Details Name: Diana Villa MRN: [id number] DOB: May 20, 1933 Today's Date: 12/26/2019    History of present illness 86 yo admitted with hip pain unable to ambulate without report of fall. Pt with left hip fx s/p anterior THA. PMHx: HTN, HLD, CKD, anemia   OT comments  Pt progressing toward established OT goals, d/c venue updated this date as pt was declined for CIR. Pt required modA to stand and minA for functional mobility at RW level. She completed grooming while seated at sink level. Pt will continue to benefit from skilled OT services to maximize safety and independence with ADL/IADL and functional mobility. Will continue to follow acutely and progress as tolerated.    Follow Up Recommendations  SNF    Equipment Recommendations  3 in 1 bedside commode    Recommendations for Other Services      Precautions / Restrictions Precautions Precautions: Fall Restrictions Weight Bearing Restrictions: No LLE Weight Bearing: Weight bearing as tolerated       Mobility Bed Mobility Overal bed mobility: Needs Assistance Bed Mobility: Supine to Sit;Sit to Supine     Supine to sit: Min assist;HOB elevated Sit to supine: Min assist;HOB elevated   General bed mobility comments: mina for LLE management  Transfers Overall transfer level: Needs assistance Equipment used: Rolling walker (2 wheeled) Transfers: Sit to/from Stand Sit to Stand: Mod assist         General transfer comment: modA to powerup from surface    Balance Overall balance assessment: Needs assistance Sitting-balance support: No upper extremity supported;Feet supported Sitting balance-Leahy Scale: Good     Standing balance support: Bilateral upper extremity supported Standing balance-Leahy Scale: Poor Standing balance comment: reliant on BUE support in standing                           ADL either performed or assessed with clinical judgement   ADL Overall  ADL's : Needs assistance/impaired     Grooming: Oral care;Wash/dry hands;Wash/dry face;Set up;Sitting Grooming Details (indicate cue type and reason): at sink level                 Toilet Transfer: Minimal assistance;Ambulation;RW Toilet Transfer Details (indicate cue type and reason): ambulated into bathroom to sink and sat on BSC         Functional mobility during ADLs: Minimal assistance;Rolling walker General ADL Comments: decreased activity tolerance, limited by pain in LLE     Vision       Perception     Praxis      Cognition Arousal/Alertness: Awake/alert Behavior During Therapy: WFL for tasks assessed/performed Overall Cognitive Status: Within Functional Limits for tasks assessed                               Problem Solving: Slow processing          Exercises General Exercises - Lower Extremity Long Arc Quad: Seated;Both;AROM;15 reps Hip ABduction/ADduction: AAROM;15 reps;Right;Left;Seated(AArOM on LLE) Hip Flexion/Marching: AROM;Both;Seated;15 reps   Shoulder Instructions       General Comments vss    Pertinent Vitals/ Pain       Pain Assessment: 0-10 Pain Score: 4  Pain Location: left hip with activity Pain Descriptors / Indicators: Aching;Tender;Guarding Pain Intervention(s): Limited activity within patient's tolerance;Monitored during session;Repositioned  Home Living  Prior Functioning/Environment              Frequency  Min 2X/week        Progress Toward Goals  OT Goals(current goals can now be found in the care plan section)  Progress towards OT goals: Progressing toward goals  Acute Rehab OT Goals Patient Stated Goal: to get stronger OT Goal Formulation: With patient Time For Goal Achievement: 01/07/20 Potential to Achieve Goals: Fair ADL Goals Pt Will Perform Lower Body Bathing: with modified independence;sitting/lateral leans;with adaptive  equipment Pt Will Perform Lower Body Dressing: with modified independence;with adaptive equipment;sit to/from stand Pt Will Transfer to Toilet: with modified independence;stand pivot transfer;bedside commode Pt Will Perform Toileting - Clothing Manipulation and hygiene: with set-up;sit to/from stand Pt Will Perform Tub/Shower Transfer: Tub transfer;rolling walker;3 in 1;ambulating  Plan Discharge plan needs to be updated    Co-evaluation                 AM-PAC OT "6 Clicks" Daily Activity     Outcome Measure   Help from another person eating meals?: None Help from another person taking care of personal grooming?: A Little Help from another person toileting, which includes using toliet, bedpan, or urinal?: A Lot Help from another person bathing (including washing, rinsing, drying)?: A Lot Help from another person to put on and taking off regular upper body clothing?: None Help from another person to put on and taking off regular lower body clothing?: A Lot 6 Click Score: 17    End of Session Equipment Utilized During Treatment: Gait belt;Rolling walker  OT Visit Diagnosis: Unsteadiness on feet (R26.81);Muscle weakness (generalized) (M62.81);Pain Pain - Right/Left: Left Pain - part of body: Leg   Activity Tolerance Patient tolerated treatment well;Patient limited by pain   Patient Left with call bell/phone within reach;in bed;with bed alarm set   Nurse Communication Mobility status        Time: 9147-8295 OT Time Calculation (min): 18 min  Charges: OT General Charges $OT Visit: 1 Visit OT Treatments $Self Care/Home Management : 8-22 mins  Dorinda Hill OTR/L Acute Rehabilitation Services Office: [place] 12/26/2019, 4:05 PM

## 2019-12-26 NOTE — Progress Notes (Addendum)
Physical Therapy Treatment Patient Details Name: Diana Villa MRN: [id number] DOB: Nov 27, 1932 Today's Date: 12/26/2019    History of Present Illness 86 yo admitted with hip pain unable to ambulate without report of fall. Pt with left hip fx s/p anterior THA. PMHx: HTN, HLD, CKD, anemia    PT Comments    Pt pleasant and willing to mobilize but remains limited by pain in left hip with activity. Pt educated for need to request pain medication as needed. Pt able to progress gait within her tolerance and perform HEP with assist. Pt reports family is working on arranging 24hr assist for her at home. Will continue to follow.     Follow Up Recommendations  Supervision/Assistance - 24 hour;SNF(insurance declined CIR)     Equipment Recommendations  3in1 (PT)    Recommendations for Other Services       Precautions / Restrictions Precautions Precautions: Fall Restrictions LLE Weight Bearing: Weight bearing as tolerated    Mobility  Bed Mobility Overal bed mobility: Needs Assistance Bed Mobility: Supine to Sit     Supine to sit: Min assist;HOB elevated     General bed mobility comments: min assist to move LLE to EOB with increased time and use of rail  Transfers Overall transfer level: Needs assistance   Transfers: Sit to/from Stand Sit to Stand: Mod assist         General transfer comment: mod assist to rise from bed and from toilet with rail. cues for hand placement and safety  Ambulation/Gait Ambulation/Gait assistance: Min assist Gait Distance (Feet): 40 Feet Assistive device: Rolling walker (2 wheeled) Gait Pattern/deviations: Step-to pattern;Decreased stride length;Trunk flexed   Gait velocity interpretation: <1.8 ft/sec, indicate of risk for recurrent falls General Gait Details: cues for posture and proximity to RW with assist to maintain control of RW, step to pattern with left stance limited. Pt walked 15' then 40'   Stairs             Wheelchair  Mobility    Modified Rankin (Stroke Patients Only)       Balance Overall balance assessment: Needs assistance   Sitting balance-Leahy Scale: Good     Standing balance support: Bilateral upper extremity supported Standing balance-Leahy Scale: Poor                              Cognition Arousal/Alertness: Awake/alert Behavior During Therapy: WFL for tasks assessed/performed Overall Cognitive Status: Within Functional Limits for tasks assessed                               Problem Solving: Slow processing        Exercises General Exercises - Lower Extremity Long Arc Quad: Seated;Both;AROM;15 reps Hip ABduction/ADduction: AAROM;15 reps;Right;Left;Seated(AArOM on LLE) Hip Flexion/Marching: AROM;Both;Seated;15 reps    General Comments        Pertinent Vitals/Pain Pain Score: 6  Pain Location: left hip with activity Pain Descriptors / Indicators: Aching;Tender;Guarding Pain Intervention(s): Limited activity within patient's tolerance;Monitored during session;Premedicated before session;Repositioned    Home Living                      Prior Function            PT Goals (current goals can now be found in the care plan section) Progress towards PT goals: Progressing toward goals    Frequency    Min 3X/week  PT Plan Current plan remains appropriate    Co-evaluation              AM-PAC PT "6 Clicks" Mobility   Outcome Measure  Help needed turning from your back to your side while in a flat bed without using bedrails?: A Little Help needed moving from lying on your back to sitting on the side of a flat bed without using bedrails?: A Little Help needed moving to and from a bed to a chair (including a wheelchair)?: A Lot Help needed standing up from a chair using your arms (e.g., wheelchair or bedside chair)?: A Lot Help needed to walk in hospital room?: A Little Help needed climbing 3-5 steps with a railing? : A  Lot 6 Click Score: 15    End of Session Equipment Utilized During Treatment: Gait belt Activity Tolerance: Patient tolerated treatment well Patient left: in chair;with call bell/phone within reach;with chair alarm set Nurse Communication: Mobility status;Weight bearing status PT Visit Diagnosis: Other abnormalities of gait and mobility (R26.89);Difficulty in walking, not elsewhere classified (R26.2);History of falling (Z91.81);Muscle weakness (generalized) (M62.81)     Time: 9643-8381 PT Time Calculation (min) (ACUTE ONLY): 23 min  Charges:  $Gait Training: 8-22 mins $Therapeutic Exercise: 8-22 mins                     Aston Lawhorn P, PT Acute Rehabilitation Services Pager: [phone] Office: [place] 12/26/2019, 1:12 PM

## 2019-12-26 NOTE — Progress Notes (Signed)
Inpatient Rehabilitation Admissions Coordinator  [facility] Medicare will not approve admit for this diagnosis for she lacks the medical necessity for continued hospital rehab. Pt is aware and states that her niece is working on getting her into [facility] where niece works. I have updated RN CM , Angela , and we will sign off at this time.  Danne Baxter, RN, MSN Rehab Admissions Coordinator [phone] 12/26/2019 12:03 PM

## 2019-12-26 NOTE — Plan of Care (Signed)

## 2019-12-27 LAB — SURGICAL PATHOLOGY

## 2019-12-27 NOTE — Progress Notes (Signed)
Report called to [place] at [facility], A discharge packet has been printed and a copy sent by [facility]. Patient aware of D/C and transportation services.

## 2019-12-27 NOTE — TOC Transition Note (Signed)
Transition of Care [facility]) - CM/SW Discharge Note   Patient Details  Name: Diana Villa MRN: [id number] Date of Birth: [birth year]  [facility] CM/SW Contact:  Sharin Mons, RN Phone Number: 12/27/2019, 9:03 AM   Clinical Narrative:    Patient will DC to: [facility] Anticipated DC date: 12/26/2019 Family notified: [facility] by: Corey Harold   Per MD patient ready for DC today. RN, patient, patient's family, and facility notified of DC. Discharge Summary and FL2 sent to facility. RN to call report prior to discharge [phone]). Rm # [phone]. DC packet on chart. Ambulance transport requested for patient.   RNCM will sign off for now as intervention is no longer needed. Please consult [place] again if new needs arise.    Final next level of care: Skilled Nursing Facility Barriers to Discharge: No Barriers Identified   Patient Goals and CMS Choice   CMS Medicare.gov Compare Post Acute Care list provided to:: Patient    Discharge Placement   Discharge Plan and Services   Discharge Planning Services: CM Consult                                 Social Determinants of Health (SDOH) Interventions     Readmission Risk Interventions No flowsheet data found.

## 2019-12-27 NOTE — Care Management Important Message (Signed)
Important Message  Patient Details  Name: Diana Villa MRN: [id number] Date of Birth: 11/06/1932   Medicare Important Message Given:  Yes     Memory Argue 12/27/2019, 12:12 PM    UNABLE TO GIVE IM BEFORE PATIENT DISCHARGE. IM MAILED TO PATIENT

## 2019-12-27 NOTE — Discharge Summary (Signed)
PATIENT DETAILS Name: Diana Villa Age: 86 y.o. Sex: female Date of Birth: 1933/03/19 MRN: [id number]. Admitting Physician: Hosie Poisson, MD JSH:[id number], Adele Barthel, DO  Admit Date: 12/20/2019 Discharge date: 12/27/2019  Recommendations for Outpatient Follow-up:  1. Follow up with PCP in 1-2 weeks 2. Please obtain CMP/CBC in one week 3. Please ensure follow-up with orthopedics  Admitted From:  Home  Disposition: SNF   Home Health: No  Equipment/Devices: None  Discharge Condition: Stable  CODE STATUS: FULL CODE  Diet recommendation:  Diet Order            Diet - low sodium heart healthy        Diet regular Room service appropriate? Yes; Fluid consistency: Thin  Diet effective now               Brief Summary: See H&P, Labs, Consult and Test reports for all details in brief, patient is a 86 year old female with history of HTN, HLD, hypothyroidism, stage IIIb chronic kidney disease who presented with 2 weeks of left hip pain-she was found to have a left hip fracture.  Brief Hospital Course: Left femoral neck fracture (closed-spontaneous with ? trivial trauma): Evaluated by orthopedics-and underwent left hip arthroplasty on 3/3.  Planned discharge to SNF today-on aspirin per orthopedics for DVT prophylaxis.  Please ensure follow-up with orthopedics.  Acute blood loss anemia from perioperative blood loss-superimposed on anemia related to chronic kidney disease: Required 1 unit of PRBC transfusion-hemoglobin stable at 7.9.  Please recheck CBC in 1 week.  AKI on CKD stage IIIb: AKI likely hemodynamically mediated-creatinine not very far from usual baseline.  Patient to follow-up with her primary nephrologist-Dr. Justin Mend on discharge.  Please recheck electrolytes in 1 week.  HTN: BP controlled-continue amlodipine  HLD: Continue statin  Hypothyroidism: Continue Synthroid  Thrombocytopenia: Mild-appears to have some amount of chronic thrombocytopenia at baseline-stable  for outpatient follow-up as this is a chronic issue.   Procedures 3/3>>Left total hip arthroplasty  Discharge Diagnoses:  Active Problems:   Left displaced femoral neck fracture (HCC)   Essential hypertension   CKD (chronic kidney disease), stage III   Hyperlipidemia   Hypothyroidism   Discharge Instructions:  Activity:  WBAT with walker  Discharge Instructions    Call MD for:  redness, tenderness, or signs of infection (pain, swelling, redness, odor or green/yellow discharge around incision site)   Complete by: As directed    Call MD for:  severe uncontrolled pain   Complete by: As directed    Call MD for:  temperature >100.4   Complete by: As directed    Diet - low sodium heart healthy   Complete by: As directed    Discharge instructions   Complete by: As directed    Repeat CBC and BMP in one week   Follow with Primary MD  Lyman Bishop, DO in 1-2 weeks  Follow with Ortho MD as instructed  Please get a complete blood count and chemistry panel checked by your Primary MD at your next visit, and again as instructed by your Primary MD.  Get Medicines reviewed and adjusted: Please take all your medications with you for your next visit with your Primary MD  Laboratory/radiological data: Please request your Primary MD to go over all hospital tests and procedure/radiological results at the follow up, please ask your Primary MD to get all Hospital records sent to his/her office.  In some cases, they will be blood work, cultures and biopsy results pending at the time of  your discharge. Please request that your primary care M.D. follows up on these results.  Also Note the following: If you experience worsening of your admission symptoms, develop shortness of breath, life threatening emergency, suicidal or homicidal thoughts you must seek medical attention immediately by calling 911 or calling your MD immediately  if symptoms less severe.  You must read complete  instructions/literature along with all the possible adverse reactions/side effects for all the Medicines you take and that have been prescribed to you. Take any new Medicines after you have completely understood and accpet all the possible adverse reactions/side effects.   Do not drive when taking Pain medications or sleeping medications (Benzodaizepines)  Do not take more than prescribed Pain, Sleep and Anxiety Medications. It is not advisable to combine anxiety,sleep and pain medications without talking with your primary care practitioner  Special Instructions: If you have smoked or chewed Tobacco  in the last 2 yrs please stop smoking, stop any regular Alcohol  and or any Recreational drug use.  Wear Seat belts while driving.  Please note: You were cared for by a hospitalist during your hospital stay. Once you are discharged, your primary care physician will handle any further medical issues. Please note that NO REFILLS for any discharge medications will be authorized once you are discharged, as it is imperative that you return to your primary care physician (or establish a relationship with a primary care physician if you do not have one) for your post hospital discharge needs so that they can reassess your need for medications and monitor your lab values.   Increase activity slowly   Complete by: As directed    Increase activity slowly   Complete by: As directed      Allergies as of 12/27/2019      Reactions   Codeine Other (See Comments)   "Made me feel like I was in another world"      Medication List    STOP taking these medications   aspirin 325 MG EC tablet Replaced by: aspirin 81 MG chewable tablet   methylPREDNISolone 4 MG Tbpk tablet Commonly known as: MEDROL DOSEPAK     TAKE these medications   acetaminophen 500 MG tablet Commonly known as: TYLENOL Take 1,000 mg by mouth in the morning and at bedtime.   amLODipine 2.5 MG tablet Commonly known as: NORVASC Take 2.5  mg by mouth 2 (two) times daily.   aspirin 81 MG chewable tablet Chew 1 tablet (81 mg total) by mouth 2 (two) times daily with a meal. Replaces: aspirin 325 MG EC tablet   atorvastatin 40 MG tablet Commonly known as: LIPITOR Take 40 mg by mouth daily.   fenofibrate 160 MG tablet Take 160 mg by mouth daily.   ferrous sulfate 325 (65 FE) MG EC tablet Take 650 mg by mouth daily with breakfast.   levothyroxine 25 MCG tablet Commonly known as: SYNTHROID Take 25 mcg by mouth daily before breakfast.   traMADol 50 MG tablet Commonly known as: ULTRAM Take 1 tablet (50 mg total) by mouth every 6 (six) hours as needed for moderate pain.   VITAMIN B-12 PO Take 1 tablet by mouth daily.   VITAMIN D PO Take 1 tablet by mouth daily.       Contact information for follow-up providers    Swinteck, Aaron Edelman, MD. Schedule an appointment as soon as possible for a visit in 2 weeks.   Specialty: Orthopedic Surgery Why: For wound re-check Contact information: [address] [place] [address] [phone]        Lyman Bishop, DO. Schedule an appointment as soon as possible for a visit in 1 week(s).   Specialty: Family Medicine Contact information: [place] [phone]            Contact information for after-discharge care    Destination    [facility] .   Service: Skilled Nursing Contact information: [address] [place] [phone]                 Allergies  Allergen Reactions  . Codeine Other (See Comments)    "Made me feel like I was in another world"    Consultations:   orthopedic surgery   Other Procedures/Studies: DG Chest 1 View  Result Date: 12/20/2019 CLINICAL DATA:  Hip fracture EXAM: CHEST  1 VIEW COMPARISON:  None. FINDINGS: Cardiac enlargement without heart failure. Atherosclerotic aortic arch. No acute abnormality  chest. No infiltrate effusion or mass. Chronic fracture right medial clavicle IMPRESSION: No active disease. Electronically Signed   By: Franchot Gallo M.D.   On: 12/20/2019 15:02   Pelvis Portable  Result Date: 12/21/2019 CLINICAL DATA:  Left hip replacement EXAM: PORTABLE PELVIS 1-2 VIEWS COMPARISON:  12/20/2019 FINDINGS: Changes of left hip replacement. Normal AP alignment. No hardware bony complicating feature. IMPRESSION: Left hip replacement.  No visible complicating feature. Electronically Signed   By: Rolm Baptise M.D.   On: 12/21/2019 16:35   DG Knee Complete 4 Views Left  Result Date: 12/20/2019 CLINICAL DATA:  Knee pain EXAM: LEFT KNEE - COMPLETE 4+ VIEW COMPARISON:  None. FINDINGS: Chronic fracture left tibial plateau fixed with 2 transverse screws. Soft tissue calcification in the medial collateral ligament. Medial joint space normal. Marked varus angulation of the knee. Thickening of the proximal fibula likely due to chronic fracture. Negative for acute fracture IMPRESSION: Chronic fractures as above. Degenerative changes most prominent in the lateral joint compartment. No acute fracture. Electronically Signed   By: Franchot Gallo M.D.   On: 12/20/2019 15:00   DG C-Arm 1-60 Min  Result Date: 12/21/2019 CLINICAL DATA:  ORIF left hip EXAM: DG C-ARM 1-60 MIN; OPERATIVE LEFT HIP WITH PELVIS COMPARISON:  12/20/2019 FINDINGS: Changes of left hip replacement. Normal AP alignment. No hardware or bony complicating feature. IMPRESSION: Left hip replacement.  No visible complicating feature. Electronically Signed   By: Rolm Baptise M.D.   On: 12/21/2019 16:33   DG HIP OPERATIVE UNILAT W OR W/O PELVIS LEFT  Result Date: 12/21/2019 CLINICAL DATA:  ORIF left hip EXAM: DG C-ARM 1-60 MIN; OPERATIVE LEFT HIP WITH PELVIS COMPARISON:  12/20/2019 FINDINGS: Changes of left hip replacement. Normal AP alignment. No hardware or bony complicating feature. IMPRESSION: Left hip replacement.  No visible complicating  feature. Electronically Signed   By: Rolm Baptise M.D.   On: 12/21/2019 16:33   DG Hip Unilat With Pelvis 2-3 Views Left  Result Date: 12/20/2019 CLINICAL DATA:  Left hip pain 2 weeks EXAM: DG HIP (WITH OR WITHOUT PELVIS) 2-3V LEFT COMPARISON:  09/04/2016 FINDINGS: Left femoral neck fracture with superior displacement of the femur as well as angulation and mild impaction. Left hip joint normal Normal right hip. No other pelvic lesions IMPRESSION: Left femoral neck fracture. Electronically Signed   By: Franchot Gallo M.D.   On: 12/20/2019 15:01     TODAY-DAY OF DISCHARGE:  Subjective:  Diana Villa today has no headache,no chest abdominal pain,no new weakness tingling or numbness, feels much better wants to go home today.   Objective:   Blood pressure (!) 130/45, pulse 81, temperature 98.2 F (36.8 C), temperature source Oral, resp. rate 16, height 5' (1.524 m), weight 50 kg, SpO2 99 %.  Intake/Output Summary (Last 24 hours) at 12/27/2019 0929 Last data filed at 12/26/2019 0930 Gross per 24 hour  Intake 240 ml  Output --  Net 240 ml   Filed Weights   12/20/19 1346 12/24/19 0800  Weight: 50 kg 50 kg    Exam: Awake Alert, Oriented *3, No new F.N deficits, Normal affect [place].AT,PERRAL Supple Neck,No JVD, No cervical lymphadenopathy appriciated.  Symmetrical Chest wall movement, Good air movement bilaterally, CTAB RRR,No Gallops,Rubs or new Murmurs, No Parasternal Heave +ve B.Sounds, Abd Soft, Non tender, No organomegaly appriciated, No rebound -guarding or rigidity. No Cyanosis, Clubbing or edema, No new Rash or bruise   PERTINENT RADIOLOGIC STUDIES: No results found.   PERTINENT LAB RESULTS: CBC: No results for input(s): WBC, HGB, HCT, PLT in the last 72 hours. CMET CMP     Component Value Date/Time   NA 140 12/25/2019 0402   K 4.9 12/25/2019 0402   CL 109 12/25/2019 0402   CO2 20 (L) 12/25/2019 0402   GLUCOSE 89 12/25/2019 0402   BUN 28 (H) 12/25/2019 0402    CREATININE 1.73 (H) 12/25/2019 0402   CALCIUM 8.9 12/25/2019 0402   CALCIUM 8.8 08/26/2013 1035   PROT 5.2 (L) 12/21/2019 0346   ALBUMIN 3.1 (L) 12/21/2019 0346   AST 39 12/21/2019 0346   ALT 26 12/21/2019 0346   ALKPHOS 66 12/21/2019 0346   BILITOT 0.7 12/21/2019 0346   GFRNONAA 27 (L) 12/25/2019 0402   GFRAA 31 (L) 12/25/2019 0402    GFR Estimated Creatinine Clearance: 17.4 mL/min (A) (by C-G formula based on SCr of 1.73 mg/dL (H)). No results for input(s): LIPASE, AMYLASE in the last 72 hours. No results for input(s): CKTOTAL, CKMB, CKMBINDEX, TROPONINI in the last 72 hours. Invalid input(s): POCBNP No results for input(s): DDIMER in the last 72 hours. No results for input(s): HGBA1C in the last 72 hours. No results for input(s): CHOL, HDL, LDLCALC, TRIG, CHOLHDL, LDLDIRECT in the last 72 hours. No results for input(s): TSH, T4TOTAL, T3FREE, THYROIDAB in the last 72 hours.  Invalid input(s): FREET3 No results for input(s): VITAMINB12, FOLATE, FERRITIN, TIBC, IRON, RETICCTPCT in the last 72 hours. Coags: No results for input(s): INR in the last 72 hours.  Invalid input(s): PT Microbiology: Recent Results (from the past 240 hour(s))  SARS CORONAVIRUS 2 (TAT 6-24 HRS) Nasopharyngeal Nasopharyngeal Swab     Status: None   Collection Time: 12/20/19  4:11 PM   Specimen: Nasopharyngeal Swab  Result Value Ref Range Status   SARS Coronavirus 2 NEGATIVE NEGATIVE Final    Comment: (NOTE) SARS-CoV-2 target nucleic acids are NOT DETECTED. The SARS-CoV-2 RNA is generally detectable in upper and lower respiratory specimens during the acute phase of infection. Negative results do not preclude SARS-CoV-2 infection, do not rule out co-infections with other pathogens, and should not be used as the sole basis for treatment or other patient management decisions. Negative results must be combined with clinical observations, patient history, and epidemiological information. The  expected result is Negative. Fact Sheet for Patients: SugarRoll.be Fact Sheet for Healthcare Providers: https://www.woods-mathews.com/ This test is not yet approved or cleared by the [place] FDA and  has been authorized for  detection and/or diagnosis of SARS-CoV-2 by FDA under an Emergency Use Authorization (EUA). This EUA will remain  in effect (meaning this test can be used) for the duration of the COVID-19 declaration under Section 56 4(b)(1) of the Act, 21 U.S.C. section 360bbb-3(b)(1), unless the authorization is terminated or revoked sooner. Performed at [facility], [place] [address], [place], [place] [address]   Surgical pcr screen     Status: Abnormal   Collection Time: 12/20/19  6:11 PM   Specimen: Nasal Mucosa; Nasal Swab  Result Value Ref Range Status   MRSA, PCR NEGATIVE NEGATIVE Final   Staphylococcus aureus POSITIVE (A) NEGATIVE Final    Comment: (NOTE) The Xpert SA Assay (FDA approved for NASAL specimens in patients 17 years of age and older), is one component of a comprehensive surveillance program. It is not intended to diagnose infection nor to guide or monitor treatment. Performed at [facility], [place] [address], [place], [place] [address]   SARS CORONAVIRUS 2 (TAT 6-24 HRS) Nasopharyngeal Nasopharyngeal Swab     Status: None   Collection Time: 12/26/19  1:13 PM   Specimen: Nasopharyngeal Swab  Result Value Ref Range Status   SARS Coronavirus 2 NEGATIVE NEGATIVE Final    Comment: (NOTE) SARS-CoV-2 target nucleic acids are NOT DETECTED. The SARS-CoV-2 RNA is generally detectable in upper and lower respiratory specimens during the acute phase of infection. Negative results do not preclude SARS-CoV-2 infection, do not rule out co-infections with other pathogens, and should not be used as the sole basis for treatment or other patient management decisions. Negative results must be combined with clinical  observations, patient history, and epidemiological information. The expected result is Negative. Fact Sheet for Patients: SugarRoll.be Fact Sheet for Healthcare Providers: https://www.woods-mathews.com/ This test is not yet approved or cleared by the [place] FDA and  has been authorized for detection and/or diagnosis of SARS-CoV-2 by FDA under an Emergency Use Authorization (EUA). This EUA will remain  in effect (meaning this test can be used) for the duration of the COVID-19 declaration under Section 56 4(b)(1) of the Act, 21 U.S.C. section 360bbb-3(b)(1), unless the authorization is terminated or revoked sooner. Performed at [facility], [place] [address], [place], [place] [address]     FURTHER DISCHARGE INSTRUCTIONS:  Get Medicines reviewed and adjusted: Please take all your medications with you for your next visit with your Primary MD  Laboratory/radiological data: Please request your Primary MD to go over all hospital tests and procedure/radiological results at the follow up, please ask your Primary MD to get all Hospital records sent to his/her office.  In some cases, they will be blood work, cultures and biopsy results pending at the time of your discharge. Please request that your primary care M.D. goes through all the records of your hospital data and follows up on these results.  Also Note the following: If you experience worsening of your admission symptoms, develop shortness of breath, life threatening emergency, suicidal or homicidal thoughts you must seek medical attention immediately by calling 911 or calling your MD immediately  if symptoms less severe.  You must read complete instructions/literature along with all the possible adverse reactions/side effects for all the Medicines you take and that have been prescribed to you. Take any new Medicines after you have completely understood and accpet all the possible adverse  reactions/side effects.   Do not drive when taking Pain medications or sleeping medications (Benzodaizepines)  Do not take more than prescribed  Pain, Sleep and Anxiety Medications. It is not advisable to combine anxiety,sleep and pain medications without talking with your primary care practitioner  Special Instructions: If you have smoked or chewed Tobacco  in the last 2 yrs please stop smoking, stop any regular Alcohol  and or any Recreational drug use.  Wear Seat belts while driving.  Please note: You were cared for by a hospitalist during your hospital stay. Once you are discharged, your primary care physician will handle any further medical issues. Please note that NO REFILLS for any discharge medications will be authorized once you are discharged, as it is imperative that you return to your primary care physician (or establish a relationship with a primary care physician if you do not have one) for your post hospital discharge needs so that they can reassess your need for medications and monitor your lab values.  Total Time spent coordinating discharge including counseling, education and face to face time equals 35 minutes.  SignedOren Binet 12/27/2019 9:29 AM

## 2020-01-04 ENCOUNTER — Encounter (HOSPITAL_COMMUNITY): Payer: Medicare Other

## 2020-02-02 NOTE — Discharge Instructions (Signed)

## 2020-02-03 ENCOUNTER — Encounter (HOSPITAL_COMMUNITY)
Admission: RE | Admit: 2020-02-03 | Discharge: 2020-02-03 | Disposition: A | Discharge status: Home / Self Care | Payer: Medicare Other | Source: Ambulatory Visit | Attending: Nephrology | Admitting: Nephrology

## 2020-02-03 ENCOUNTER — Other Ambulatory Visit: Payer: Self-pay

## 2020-02-03 VITALS — BP 127/53 | HR 88 | Temp 97.4°F | Resp 20

## 2020-02-03 DIAGNOSIS — D638 Anemia in other chronic diseases classified elsewhere: Secondary | ICD-10-CM | POA: Insufficient documentation

## 2020-02-03 LAB — POCT HEMOGLOBIN-HEMACUE: Hemoglobin: 8.2 g/dL — ABNORMAL LOW (ref 12.0–15.0)

## 2020-02-03 LAB — IRON AND TIBC
Iron: 78 ug/dL (ref 28–170)
Saturation Ratios: 26 % (ref 10.4–31.8)
TIBC: 301 ug/dL (ref 250–450)
UIBC: 223 ug/dL

## 2020-02-03 LAB — FERRITIN: Ferritin: 1517 ng/mL — ABNORMAL HIGH (ref 11–307)

## 2020-02-03 MED ORDER — EPOETIN ALFA-EPBX 3000 UNIT/ML IJ SOLN
INTRAMUSCULAR | Status: AC
  Administered 2020-02-03: 3000 [IU] via SUBCUTANEOUS
  Filled 2020-02-03: qty 1

## 2020-02-03 MED ORDER — EPOETIN ALFA-EPBX 2000 UNIT/ML IJ SOLN
INTRAMUSCULAR | Status: AC
  Administered 2020-02-03: 2000 [IU] via SUBCUTANEOUS
  Filled 2020-02-03: qty 1

## 2020-02-03 MED ORDER — EPOETIN ALFA-EPBX 10000 UNIT/ML IJ SOLN: 5000.0000 [IU] | INTRAMUSCULAR | Status: DC

## 2020-02-10 ENCOUNTER — Other Ambulatory Visit: Payer: Self-pay

## 2020-02-10 ENCOUNTER — Encounter (HOSPITAL_COMMUNITY)
Admission: RE | Admit: 2020-02-10 | Discharge: 2020-02-10 | Disposition: A | Discharge status: Home / Self Care | Payer: Medicare Other | Source: Ambulatory Visit | Attending: Nephrology | Admitting: Nephrology

## 2020-02-10 VITALS — BP 134/57 | HR 85 | Temp 97.2°F | Resp 20

## 2020-02-10 DIAGNOSIS — D638 Anemia in other chronic diseases classified elsewhere: Secondary | ICD-10-CM

## 2020-02-10 LAB — POCT HEMOGLOBIN-HEMACUE: Hemoglobin: 8.6 g/dL — ABNORMAL LOW (ref 12.0–15.0)

## 2020-02-10 MED ORDER — EPOETIN ALFA-EPBX 2000 UNIT/ML IJ SOLN
INTRAMUSCULAR | Status: AC
  Administered 2020-02-10: 5000 [IU]
  Filled 2020-02-10: qty 1

## 2020-02-10 MED ORDER — EPOETIN ALFA-EPBX 3000 UNIT/ML IJ SOLN
INTRAMUSCULAR | Status: AC
  Filled 2020-02-10: qty 1

## 2020-02-10 MED ORDER — EPOETIN ALFA-EPBX 10000 UNIT/ML IJ SOLN: 5000.0000 [IU] | INTRAMUSCULAR | Status: DC

## 2020-02-17 ENCOUNTER — Ambulatory Visit (HOSPITAL_COMMUNITY)
Admission: RE | Admit: 2020-02-17 | Discharge: 2020-02-17 | Disposition: A | Discharge status: Home / Self Care | Payer: Medicare Other | Source: Ambulatory Visit | Attending: Nephrology | Admitting: Nephrology

## 2020-02-17 VITALS — BP 132/69 | HR 87 | Resp 18

## 2020-02-17 DIAGNOSIS — D638 Anemia in other chronic diseases classified elsewhere: Secondary | ICD-10-CM | POA: Insufficient documentation

## 2020-02-17 LAB — POCT HEMOGLOBIN-HEMACUE: Hemoglobin: 9.6 g/dL — ABNORMAL LOW (ref 12.0–15.0)

## 2020-02-17 MED ORDER — EPOETIN ALFA-EPBX 2000 UNIT/ML IJ SOLN
INTRAMUSCULAR | Status: AC
  Administered 2020-02-17: 2000 [IU] via SUBCUTANEOUS
  Filled 2020-02-17: qty 1

## 2020-02-17 MED ORDER — EPOETIN ALFA-EPBX 3000 UNIT/ML IJ SOLN
INTRAMUSCULAR | Status: AC
  Administered 2020-02-17: 3000 [IU] via SUBCUTANEOUS
  Filled 2020-02-17: qty 1

## 2020-02-17 MED ORDER — EPOETIN ALFA-EPBX 10000 UNIT/ML IJ SOLN: 5000.0000 [IU] | INTRAMUSCULAR | Status: DC

## 2020-02-23 ENCOUNTER — Other Ambulatory Visit (HOSPITAL_COMMUNITY): Payer: Self-pay | Admitting: *Deleted

## 2020-02-24 ENCOUNTER — Other Ambulatory Visit: Payer: Self-pay

## 2020-02-24 ENCOUNTER — Encounter (HOSPITAL_COMMUNITY)
Admission: RE | Admit: 2020-02-24 | Discharge: 2020-02-24 | Disposition: A | Discharge status: Home / Self Care | Payer: Medicare Other | Source: Ambulatory Visit | Attending: Nephrology | Admitting: Nephrology

## 2020-02-24 VITALS — BP 134/67 | HR 86 | Resp 18

## 2020-02-24 DIAGNOSIS — D638 Anemia in other chronic diseases classified elsewhere: Secondary | ICD-10-CM

## 2020-02-24 LAB — POCT HEMOGLOBIN-HEMACUE: Hemoglobin: 9.9 g/dL — ABNORMAL LOW (ref 12.0–15.0)

## 2020-02-24 MED ORDER — EPOETIN ALFA-EPBX 10000 UNIT/ML IJ SOLN: 5000.0000 [IU] | INTRAMUSCULAR | Status: DC

## 2020-02-24 MED ORDER — EPOETIN ALFA-EPBX 3000 UNIT/ML IJ SOLN
INTRAMUSCULAR | Status: AC
  Administered 2020-02-24: 3000 [IU] via SUBCUTANEOUS
  Filled 2020-02-24: qty 1

## 2020-02-24 MED ORDER — EPOETIN ALFA-EPBX 2000 UNIT/ML IJ SOLN
INTRAMUSCULAR | Status: AC
  Administered 2020-02-24: 2000 [IU] via SUBCUTANEOUS
  Filled 2020-02-24: qty 1

## 2020-03-09 ENCOUNTER — Encounter (HOSPITAL_COMMUNITY): Payer: Medicare Other

## 2020-03-12 ENCOUNTER — Ambulatory Visit (HOSPITAL_COMMUNITY)
Admission: RE | Admit: 2020-03-12 | Discharge: 2020-03-12 | Disposition: A | Discharge status: Home / Self Care | Payer: Medicare Other | Source: Ambulatory Visit | Attending: Nephrology | Admitting: Nephrology

## 2020-03-12 ENCOUNTER — Other Ambulatory Visit: Payer: Self-pay

## 2020-03-12 VITALS — BP 142/62 | HR 75 | Temp 96.7°F | Resp 18

## 2020-03-12 DIAGNOSIS — D638 Anemia in other chronic diseases classified elsewhere: Secondary | ICD-10-CM | POA: Insufficient documentation

## 2020-03-12 LAB — IRON AND TIBC
Iron: 129 ug/dL (ref 28–170)
Saturation Ratios: 39 % — ABNORMAL HIGH (ref 10.4–31.8)
TIBC: 332 ug/dL (ref 250–450)
UIBC: 203 ug/dL

## 2020-03-12 LAB — POCT HEMOGLOBIN-HEMACUE: Hemoglobin: 9.8 g/dL — ABNORMAL LOW (ref 12.0–15.0)

## 2020-03-12 LAB — FERRITIN: Ferritin: 1913 ng/mL — ABNORMAL HIGH (ref 11–307)

## 2020-03-12 MED ORDER — EPOETIN ALFA-EPBX 2000 UNIT/ML IJ SOLN
5000.0000 [IU] | INTRAMUSCULAR | Status: DC
  Administered 2020-03-12: 5000 [IU] via SUBCUTANEOUS

## 2020-03-12 MED ORDER — EPOETIN ALFA-EPBX 2000 UNIT/ML IJ SOLN
INTRAMUSCULAR | Status: AC
  Filled 2020-03-12: qty 1

## 2020-03-12 MED ORDER — EPOETIN ALFA-EPBX 3000 UNIT/ML IJ SOLN
INTRAMUSCULAR | Status: AC
  Filled 2020-03-12: qty 1

## 2020-03-26 ENCOUNTER — Encounter (HOSPITAL_COMMUNITY)
Admission: RE | Admit: 2020-03-26 | Discharge: 2020-03-26 | Disposition: A | Discharge status: Home / Self Care | Payer: Medicare Other | Source: Ambulatory Visit | Attending: Nephrology | Admitting: Nephrology

## 2020-03-26 VITALS — BP 133/53 | HR 72 | Temp 97.2°F | Resp 20

## 2020-03-26 DIAGNOSIS — D638 Anemia in other chronic diseases classified elsewhere: Secondary | ICD-10-CM | POA: Diagnosis present

## 2020-03-26 LAB — POCT HEMOGLOBIN-HEMACUE: Hemoglobin: 9.7 g/dL — ABNORMAL LOW (ref 12.0–15.0)

## 2020-03-26 MED ORDER — EPOETIN ALFA-EPBX 2000 UNIT/ML IJ SOLN
INTRAMUSCULAR | Status: AC
  Filled 2020-03-26: qty 1

## 2020-03-26 MED ORDER — EPOETIN ALFA-EPBX 3000 UNIT/ML IJ SOLN
INTRAMUSCULAR | Status: AC
  Filled 2020-03-26: qty 1

## 2020-03-26 MED ORDER — EPOETIN ALFA-EPBX 10000 UNIT/ML IJ SOLN
5000.0000 [IU] | INTRAMUSCULAR | Status: DC
  Administered 2020-03-26: 5000 [IU] via SUBCUTANEOUS

## 2020-04-09 ENCOUNTER — Encounter (HOSPITAL_COMMUNITY)
Admission: RE | Admit: 2020-04-09 | Discharge: 2020-04-09 | Disposition: A | Discharge status: Home / Self Care | Payer: Medicare Other | Source: Ambulatory Visit | Attending: Nephrology | Admitting: Nephrology

## 2020-04-09 ENCOUNTER — Other Ambulatory Visit: Payer: Self-pay

## 2020-04-09 VITALS — BP 142/63 | HR 81 | Temp 97.4°F | Resp 18

## 2020-04-09 DIAGNOSIS — D638 Anemia in other chronic diseases classified elsewhere: Secondary | ICD-10-CM

## 2020-04-09 LAB — FERRITIN: Ferritin: 1582 ng/mL — ABNORMAL HIGH (ref 11–307)

## 2020-04-09 LAB — IRON AND TIBC
Iron: 133 ug/dL (ref 28–170)
Saturation Ratios: 39 % — ABNORMAL HIGH (ref 10.4–31.8)
TIBC: 339 ug/dL (ref 250–450)
UIBC: 206 ug/dL

## 2020-04-09 MED ORDER — EPOETIN ALFA-EPBX 10000 UNIT/ML IJ SOLN: 5000.0000 [IU] | INTRAMUSCULAR | Status: DC

## 2020-04-09 MED ORDER — EPOETIN ALFA-EPBX 2000 UNIT/ML IJ SOLN
INTRAMUSCULAR | Status: AC
  Administered 2020-04-09: 2000 [IU]
  Filled 2020-04-09: qty 1

## 2020-04-09 MED ORDER — EPOETIN ALFA-EPBX 3000 UNIT/ML IJ SOLN
INTRAMUSCULAR | Status: AC
  Administered 2020-04-09: 3000 [IU]
  Filled 2020-04-09: qty 1

## 2020-04-16 LAB — POCT HEMOGLOBIN-HEMACUE: Hemoglobin: 9.8 g/dL — ABNORMAL LOW (ref 12.0–15.0)

## 2020-04-24 ENCOUNTER — Ambulatory Visit (HOSPITAL_COMMUNITY)
Admission: RE | Admit: 2020-04-24 | Discharge: 2020-04-24 | Disposition: A | Discharge status: Home / Self Care | Payer: Medicare Other | Source: Ambulatory Visit | Attending: Nephrology | Admitting: Nephrology

## 2020-04-24 ENCOUNTER — Other Ambulatory Visit: Payer: Self-pay

## 2020-04-24 DIAGNOSIS — D638 Anemia in other chronic diseases classified elsewhere: Secondary | ICD-10-CM | POA: Insufficient documentation

## 2020-04-24 MED ORDER — EPOETIN ALFA-EPBX 3000 UNIT/ML IJ SOLN
INTRAMUSCULAR | Status: AC
  Filled 2020-04-24: qty 1

## 2020-04-24 MED ORDER — EPOETIN ALFA-EPBX 2000 UNIT/ML IJ SOLN
INTRAMUSCULAR | Status: AC
  Filled 2020-04-24: qty 1

## 2020-04-24 MED ORDER — EPOETIN ALFA-EPBX 10000 UNIT/ML IJ SOLN
5000.0000 [IU] | INTRAMUSCULAR | Status: DC
  Administered 2020-04-24: 5000 [IU] via SUBCUTANEOUS

## 2020-04-25 LAB — POCT HEMOGLOBIN-HEMACUE: Hemoglobin: 9.5 g/dL — ABNORMAL LOW (ref 12.0–15.0)

## 2020-05-08 ENCOUNTER — Ambulatory Visit (HOSPITAL_COMMUNITY)
Admission: RE | Admit: 2020-05-08 | Discharge: 2020-05-08 | Disposition: A | Discharge status: Home / Self Care | Payer: Medicare Other | Source: Ambulatory Visit | Attending: Nephrology | Admitting: Nephrology

## 2020-05-08 ENCOUNTER — Other Ambulatory Visit: Payer: Self-pay

## 2020-05-08 VITALS — BP 133/58 | HR 75 | Resp 18

## 2020-05-08 DIAGNOSIS — D638 Anemia in other chronic diseases classified elsewhere: Secondary | ICD-10-CM | POA: Insufficient documentation

## 2020-05-08 LAB — FERRITIN: Ferritin: 2303 ng/mL — ABNORMAL HIGH (ref 11–307)

## 2020-05-08 LAB — IRON AND TIBC
Iron: 132 ug/dL (ref 28–170)
Saturation Ratios: 39 % — ABNORMAL HIGH (ref 10.4–31.8)
TIBC: 335 ug/dL (ref 250–450)
UIBC: 203 ug/dL

## 2020-05-08 LAB — POCT HEMOGLOBIN-HEMACUE: Hemoglobin: 10 g/dL — ABNORMAL LOW (ref 12.0–15.0)

## 2020-05-08 MED ORDER — EPOETIN ALFA-EPBX 2000 UNIT/ML IJ SOLN
INTRAMUSCULAR | Status: AC
  Administered 2020-05-08: 2000 [IU] via SUBCUTANEOUS
  Filled 2020-05-08: qty 1

## 2020-05-08 MED ORDER — EPOETIN ALFA-EPBX 3000 UNIT/ML IJ SOLN
INTRAMUSCULAR | Status: AC
  Administered 2020-05-08: 3000 [IU] via SUBCUTANEOUS
  Filled 2020-05-08: qty 1

## 2020-05-08 MED ORDER — EPOETIN ALFA-EPBX 10000 UNIT/ML IJ SOLN: 5000.0000 [IU] | INTRAMUSCULAR | Status: DC

## 2020-05-22 ENCOUNTER — Other Ambulatory Visit: Payer: Self-pay

## 2020-05-22 ENCOUNTER — Encounter (HOSPITAL_COMMUNITY)
Admission: RE | Admit: 2020-05-22 | Discharge: 2020-05-22 | Disposition: A | Discharge status: Home / Self Care | Payer: Medicare Other | Source: Ambulatory Visit | Attending: Nephrology | Admitting: Nephrology

## 2020-05-22 VITALS — BP 134/56 | HR 78 | Temp 98.1°F | Resp 18

## 2020-05-22 DIAGNOSIS — D638 Anemia in other chronic diseases classified elsewhere: Secondary | ICD-10-CM | POA: Insufficient documentation

## 2020-05-22 LAB — POCT HEMOGLOBIN-HEMACUE: Hemoglobin: 9 g/dL — ABNORMAL LOW (ref 12.0–15.0)

## 2020-05-22 MED ORDER — EPOETIN ALFA-EPBX 3000 UNIT/ML IJ SOLN
INTRAMUSCULAR | Status: AC
  Administered 2020-05-22: 3000 [IU] via SUBCUTANEOUS
  Filled 2020-05-22: qty 1

## 2020-05-22 MED ORDER — EPOETIN ALFA-EPBX 2000 UNIT/ML IJ SOLN
INTRAMUSCULAR | Status: AC
  Administered 2020-05-22: 2000 [IU] via SUBCUTANEOUS
  Filled 2020-05-22: qty 1

## 2020-05-22 MED ORDER — EPOETIN ALFA-EPBX 10000 UNIT/ML IJ SOLN: 5000.0000 [IU] | INTRAMUSCULAR | Status: DC

## 2020-06-05 ENCOUNTER — Encounter (HOSPITAL_COMMUNITY)
Admission: RE | Admit: 2020-06-05 | Discharge: 2020-06-05 | Disposition: A | Discharge status: Home / Self Care | Payer: Medicare Other | Source: Ambulatory Visit | Attending: Nephrology | Admitting: Nephrology

## 2020-06-05 ENCOUNTER — Other Ambulatory Visit: Payer: Self-pay

## 2020-06-05 VITALS — BP 142/56 | HR 70 | Temp 98.2°F | Resp 18

## 2020-06-05 DIAGNOSIS — D638 Anemia in other chronic diseases classified elsewhere: Secondary | ICD-10-CM | POA: Diagnosis not present

## 2020-06-05 LAB — IRON AND TIBC
Iron: 131 ug/dL (ref 28–170)
Saturation Ratios: 36 % — ABNORMAL HIGH (ref 10.4–31.8)
TIBC: 361 ug/dL (ref 250–450)
UIBC: 230 ug/dL

## 2020-06-05 LAB — POCT HEMOGLOBIN-HEMACUE: Hemoglobin: 9.8 g/dL — ABNORMAL LOW (ref 12.0–15.0)

## 2020-06-05 LAB — FERRITIN: Ferritin: 1830 ng/mL — ABNORMAL HIGH (ref 11–307)

## 2020-06-05 MED ORDER — EPOETIN ALFA-EPBX 3000 UNIT/ML IJ SOLN
INTRAMUSCULAR | Status: AC
  Administered 2020-06-05: 3000 [IU] via SUBCUTANEOUS
  Filled 2020-06-05: qty 1

## 2020-06-05 MED ORDER — EPOETIN ALFA-EPBX 3000 UNIT/ML IJ SOLN: 5000.0000 [IU] | INTRAMUSCULAR | Status: DC

## 2020-06-05 MED ORDER — EPOETIN ALFA-EPBX 2000 UNIT/ML IJ SOLN
INTRAMUSCULAR | Status: AC
  Administered 2020-06-05: 2000 [IU] via SUBCUTANEOUS
  Filled 2020-06-05: qty 1

## 2020-06-19 ENCOUNTER — Other Ambulatory Visit: Payer: Self-pay

## 2020-06-19 ENCOUNTER — Encounter (HOSPITAL_COMMUNITY)
Admission: RE | Admit: 2020-06-19 | Discharge: 2020-06-19 | Disposition: A | Discharge status: Home / Self Care | Payer: Medicare Other | Source: Ambulatory Visit | Attending: Nephrology | Admitting: Nephrology

## 2020-06-19 VITALS — BP 118/57 | HR 84

## 2020-06-19 DIAGNOSIS — D638 Anemia in other chronic diseases classified elsewhere: Secondary | ICD-10-CM | POA: Diagnosis not present

## 2020-06-19 LAB — POCT HEMOGLOBIN-HEMACUE: Hemoglobin: 9.1 g/dL — ABNORMAL LOW (ref 12.0–15.0)

## 2020-06-19 MED ORDER — EPOETIN ALFA-EPBX 10000 UNIT/ML IJ SOLN: 5000.0000 [IU] | INTRAMUSCULAR | Status: DC

## 2020-06-19 MED ORDER — EPOETIN ALFA-EPBX 3000 UNIT/ML IJ SOLN
INTRAMUSCULAR | Status: AC
  Administered 2020-06-19: 3000 [IU] via SUBCUTANEOUS
  Filled 2020-06-19: qty 1

## 2020-06-19 MED ORDER — EPOETIN ALFA-EPBX 2000 UNIT/ML IJ SOLN
INTRAMUSCULAR | Status: AC
  Administered 2020-06-19: 2000 [IU] via SUBCUTANEOUS
  Filled 2020-06-19: qty 1

## 2020-07-03 ENCOUNTER — Encounter (HOSPITAL_COMMUNITY)
Admission: RE | Admit: 2020-07-03 | Discharge: 2020-07-03 | Disposition: A | Discharge status: Home / Self Care | Payer: Medicare Other | Source: Ambulatory Visit | Attending: Nephrology | Admitting: Nephrology

## 2020-07-03 ENCOUNTER — Other Ambulatory Visit: Payer: Self-pay

## 2020-07-03 VITALS — BP 134/56 | HR 77 | Resp 18

## 2020-07-03 DIAGNOSIS — D638 Anemia in other chronic diseases classified elsewhere: Secondary | ICD-10-CM | POA: Diagnosis present

## 2020-07-03 LAB — POCT HEMOGLOBIN-HEMACUE: Hemoglobin: 8.7 g/dL — ABNORMAL LOW (ref 12.0–15.0)

## 2020-07-03 LAB — FERRITIN: Ferritin: 1863 ng/mL — ABNORMAL HIGH (ref 11–307)

## 2020-07-03 LAB — IRON AND TIBC
Iron: 127 ug/dL (ref 28–170)
Saturation Ratios: 41 % — ABNORMAL HIGH (ref 10.4–31.8)
TIBC: 311 ug/dL (ref 250–450)
UIBC: 184 ug/dL

## 2020-07-03 MED ORDER — EPOETIN ALFA-EPBX 10000 UNIT/ML IJ SOLN
10000.0000 [IU] | INTRAMUSCULAR | Status: DC
  Administered 2020-07-03: 10000 [IU] via SUBCUTANEOUS

## 2020-07-03 MED ORDER — EPOETIN ALFA-EPBX 10000 UNIT/ML IJ SOLN
INTRAMUSCULAR | Status: AC
  Filled 2020-07-03: qty 1

## 2020-07-17 ENCOUNTER — Other Ambulatory Visit: Payer: Self-pay

## 2020-07-17 ENCOUNTER — Encounter (HOSPITAL_COMMUNITY)
Admission: RE | Admit: 2020-07-17 | Discharge: 2020-07-17 | Disposition: A | Discharge status: Home / Self Care | Payer: Medicare Other | Source: Ambulatory Visit | Attending: Nephrology | Admitting: Nephrology

## 2020-07-17 VITALS — BP 124/48 | HR 72 | Temp 98.3°F | Resp 20

## 2020-07-17 DIAGNOSIS — D638 Anemia in other chronic diseases classified elsewhere: Secondary | ICD-10-CM | POA: Diagnosis not present

## 2020-07-17 LAB — POCT HEMOGLOBIN-HEMACUE: Hemoglobin: 8.7 g/dL — ABNORMAL LOW (ref 12.0–15.0)

## 2020-07-17 MED ORDER — EPOETIN ALFA-EPBX 10000 UNIT/ML IJ SOLN
INTRAMUSCULAR | Status: AC
  Administered 2020-07-17: 10000 [IU] via SUBCUTANEOUS
  Filled 2020-07-17: qty 1

## 2020-07-17 MED ORDER — EPOETIN ALFA-EPBX 10000 UNIT/ML IJ SOLN: 10000.0000 [IU] | INTRAMUSCULAR | Status: DC

## 2020-07-31 ENCOUNTER — Other Ambulatory Visit: Payer: Self-pay

## 2020-07-31 ENCOUNTER — Encounter (HOSPITAL_COMMUNITY)
Admission: RE | Admit: 2020-07-31 | Discharge: 2020-07-31 | Disposition: A | Discharge status: Home / Self Care | Payer: Medicare Other | Source: Ambulatory Visit | Attending: Nephrology | Admitting: Nephrology

## 2020-07-31 VITALS — BP 144/56 | HR 76 | Temp 98.0°F | Resp 20

## 2020-07-31 DIAGNOSIS — D638 Anemia in other chronic diseases classified elsewhere: Secondary | ICD-10-CM | POA: Insufficient documentation

## 2020-07-31 LAB — FERRITIN: Ferritin: 1803 ng/mL — ABNORMAL HIGH (ref 11–307)

## 2020-07-31 LAB — IRON AND TIBC
Iron: 108 ug/dL (ref 28–170)
Saturation Ratios: 33 % — ABNORMAL HIGH (ref 10.4–31.8)
TIBC: 332 ug/dL (ref 250–450)
UIBC: 224 ug/dL

## 2020-07-31 LAB — POCT HEMOGLOBIN-HEMACUE: Hemoglobin: 9.4 g/dL — ABNORMAL LOW (ref 12.0–15.0)

## 2020-07-31 MED ORDER — EPOETIN ALFA-EPBX 10000 UNIT/ML IJ SOLN: 10000.0000 [IU] | INTRAMUSCULAR | Status: DC

## 2020-07-31 MED ORDER — EPOETIN ALFA-EPBX 10000 UNIT/ML IJ SOLN
INTRAMUSCULAR | Status: AC
  Administered 2020-07-31: 10000 [IU] via SUBCUTANEOUS
  Filled 2020-07-31: qty 1

## 2020-08-14 ENCOUNTER — Encounter (HOSPITAL_COMMUNITY)
Admission: RE | Admit: 2020-08-14 | Discharge: 2020-08-14 | Disposition: A | Discharge status: Home / Self Care | Payer: Medicare Other | Source: Ambulatory Visit | Attending: Nephrology | Admitting: Nephrology

## 2020-08-14 ENCOUNTER — Other Ambulatory Visit: Payer: Self-pay

## 2020-08-14 VITALS — BP 136/62 | HR 88 | Temp 98.2°F | Resp 18

## 2020-08-14 DIAGNOSIS — D638 Anemia in other chronic diseases classified elsewhere: Secondary | ICD-10-CM | POA: Diagnosis not present

## 2020-08-14 LAB — POCT HEMOGLOBIN-HEMACUE: Hemoglobin: 9.7 g/dL — ABNORMAL LOW (ref 12.0–15.0)

## 2020-08-14 MED ORDER — EPOETIN ALFA-EPBX 10000 UNIT/ML IJ SOLN: 10000.0000 [IU] | INTRAMUSCULAR | Status: DC

## 2020-08-14 MED ORDER — EPOETIN ALFA-EPBX 10000 UNIT/ML IJ SOLN
INTRAMUSCULAR | Status: AC
  Administered 2020-08-14: 10000 [IU] via SUBCUTANEOUS
  Filled 2020-08-14: qty 1

## 2020-08-28 ENCOUNTER — Other Ambulatory Visit: Payer: Self-pay

## 2020-08-28 ENCOUNTER — Ambulatory Visit (HOSPITAL_COMMUNITY)
Admission: RE | Admit: 2020-08-28 | Discharge: 2020-08-28 | Disposition: A | Discharge status: Home / Self Care | Payer: Medicare Other | Source: Ambulatory Visit | Attending: Nephrology | Admitting: Nephrology

## 2020-08-28 VITALS — BP 128/61 | HR 85 | Temp 97.7°F | Resp 18

## 2020-08-28 DIAGNOSIS — D638 Anemia in other chronic diseases classified elsewhere: Secondary | ICD-10-CM | POA: Insufficient documentation

## 2020-08-28 LAB — IRON AND TIBC
Iron: 126 ug/dL (ref 28–170)
Saturation Ratios: 38 % — ABNORMAL HIGH (ref 10.4–31.8)
TIBC: 332 ug/dL (ref 250–450)
UIBC: 206 ug/dL

## 2020-08-28 LAB — POCT HEMOGLOBIN-HEMACUE: Hemoglobin: 9.3 g/dL — ABNORMAL LOW (ref 12.0–15.0)

## 2020-08-28 LAB — FERRITIN: Ferritin: 1420 ng/mL — ABNORMAL HIGH (ref 11–307)

## 2020-08-28 MED ORDER — EPOETIN ALFA-EPBX 10000 UNIT/ML IJ SOLN
INTRAMUSCULAR | Status: AC
  Administered 2020-08-28: 10000 [IU] via SUBCUTANEOUS
  Filled 2020-08-28: qty 1

## 2020-08-28 MED ORDER — EPOETIN ALFA-EPBX 10000 UNIT/ML IJ SOLN: 10000.0000 [IU] | INTRAMUSCULAR | Status: DC

## 2020-09-11 ENCOUNTER — Encounter (HOSPITAL_COMMUNITY)
Admission: RE | Admit: 2020-09-11 | Discharge: 2020-09-11 | Disposition: A | Discharge status: Home / Self Care | Payer: Medicare Other | Source: Ambulatory Visit | Attending: Nephrology | Admitting: Nephrology

## 2020-09-11 ENCOUNTER — Encounter (HOSPITAL_COMMUNITY): Payer: Medicare Other

## 2020-09-11 ENCOUNTER — Other Ambulatory Visit: Payer: Self-pay

## 2020-09-11 VITALS — BP 129/53 | HR 80 | Temp 97.8°F | Resp 20

## 2020-09-11 DIAGNOSIS — D638 Anemia in other chronic diseases classified elsewhere: Secondary | ICD-10-CM | POA: Diagnosis present

## 2020-09-11 LAB — POCT HEMOGLOBIN-HEMACUE: Hemoglobin: 9.9 g/dL — ABNORMAL LOW (ref 12.0–15.0)

## 2020-09-11 MED ORDER — EPOETIN ALFA-EPBX 10000 UNIT/ML IJ SOLN
INTRAMUSCULAR | Status: AC
  Filled 2020-09-11: qty 1

## 2020-09-11 MED ORDER — EPOETIN ALFA-EPBX 10000 UNIT/ML IJ SOLN
10000.0000 [IU] | INTRAMUSCULAR | Status: DC
  Administered 2020-09-11: 10000 [IU] via SUBCUTANEOUS

## 2020-09-25 ENCOUNTER — Other Ambulatory Visit: Payer: Self-pay

## 2020-09-25 ENCOUNTER — Encounter (HOSPITAL_COMMUNITY)
Admission: RE | Admit: 2020-09-25 | Discharge: 2020-09-25 | Disposition: A | Discharge status: Home / Self Care | Payer: Medicare Other | Source: Ambulatory Visit | Attending: Nephrology | Admitting: Nephrology

## 2020-09-25 VITALS — BP 141/63 | HR 74 | Temp 97.5°F | Resp 20

## 2020-09-25 DIAGNOSIS — D638 Anemia in other chronic diseases classified elsewhere: Secondary | ICD-10-CM | POA: Insufficient documentation

## 2020-09-25 LAB — IRON AND TIBC
Iron: 142 ug/dL (ref 28–170)
Saturation Ratios: 41 % — ABNORMAL HIGH (ref 10.4–31.8)
TIBC: 350 ug/dL (ref 250–450)
UIBC: 208 ug/dL

## 2020-09-25 LAB — POCT HEMOGLOBIN-HEMACUE: Hemoglobin: 9.7 g/dL — ABNORMAL LOW (ref 12.0–15.0)

## 2020-09-25 LAB — FERRITIN: Ferritin: 1667 ng/mL — ABNORMAL HIGH (ref 11–307)

## 2020-09-25 MED ORDER — EPOETIN ALFA-EPBX 10000 UNIT/ML IJ SOLN: 10000.0000 [IU] | INTRAMUSCULAR | Status: DC

## 2020-09-25 MED ORDER — EPOETIN ALFA-EPBX 10000 UNIT/ML IJ SOLN
INTRAMUSCULAR | Status: AC
  Administered 2020-09-25: 10000 [IU] via SUBCUTANEOUS
  Filled 2020-09-25: qty 1

## 2020-09-25 NOTE — Progress Notes (Signed)
Pt refused next week appointment. She states she will come every two weeks, it is too difficult to come every week.  I explained every week is in efforts to get her hgb up and she refused every week appt.

## 2020-10-02 ENCOUNTER — Encounter (HOSPITAL_COMMUNITY): Payer: Medicare Other

## 2020-10-09 ENCOUNTER — Encounter (HOSPITAL_COMMUNITY)
Admission: RE | Admit: 2020-10-09 | Discharge: 2020-10-09 | Disposition: A | Discharge status: Home / Self Care | Payer: Medicare Other | Source: Ambulatory Visit | Attending: Nephrology | Admitting: Nephrology

## 2020-10-09 ENCOUNTER — Encounter (HOSPITAL_COMMUNITY): Payer: Medicare Other

## 2020-10-09 ENCOUNTER — Other Ambulatory Visit: Payer: Self-pay

## 2020-10-09 VITALS — BP 108/51 | HR 74 | Temp 97.2°F | Resp 20

## 2020-10-09 DIAGNOSIS — D638 Anemia in other chronic diseases classified elsewhere: Secondary | ICD-10-CM | POA: Diagnosis not present

## 2020-10-09 LAB — POCT HEMOGLOBIN-HEMACUE: Hemoglobin: 11 g/dL — ABNORMAL LOW (ref 12.0–15.0)

## 2020-10-09 MED ORDER — EPOETIN ALFA-EPBX 10000 UNIT/ML IJ SOLN
INTRAMUSCULAR | Status: AC
  Administered 2020-10-09: 10000 [IU] via SUBCUTANEOUS
  Filled 2020-10-09: qty 1

## 2020-10-09 MED ORDER — EPOETIN ALFA-EPBX 10000 UNIT/ML IJ SOLN: 10000.0000 [IU] | INTRAMUSCULAR | Status: DC

## 2020-10-23 ENCOUNTER — Other Ambulatory Visit: Payer: Self-pay

## 2020-10-23 ENCOUNTER — Encounter (HOSPITAL_COMMUNITY)
Admission: RE | Admit: 2020-10-23 | Discharge: 2020-10-23 | Disposition: A | Discharge status: Home / Self Care | Payer: Medicare Other | Source: Ambulatory Visit | Attending: Nephrology | Admitting: Nephrology

## 2020-10-23 VITALS — BP 138/55 | HR 76 | Temp 97.7°F | Resp 20

## 2020-10-23 DIAGNOSIS — D638 Anemia in other chronic diseases classified elsewhere: Secondary | ICD-10-CM | POA: Diagnosis not present

## 2020-10-23 LAB — IRON AND TIBC
Iron: 125 ug/dL (ref 28–170)
Saturation Ratios: 34 % — ABNORMAL HIGH (ref 10.4–31.8)
TIBC: 368 ug/dL (ref 250–450)
UIBC: 243 ug/dL

## 2020-10-23 LAB — FERRITIN: Ferritin: 1913 ng/mL — ABNORMAL HIGH (ref 11–307)

## 2020-10-23 LAB — POCT HEMOGLOBIN-HEMACUE: Hemoglobin: 9.4 g/dL — ABNORMAL LOW (ref 12.0–15.0)

## 2020-10-23 MED ORDER — EPOETIN ALFA-EPBX 10000 UNIT/ML IJ SOLN
INTRAMUSCULAR | Status: AC
  Administered 2020-10-23: 10000 [IU] via SUBCUTANEOUS
  Filled 2020-10-23: qty 1

## 2020-10-23 MED ORDER — EPOETIN ALFA-EPBX 10000 UNIT/ML IJ SOLN: 10000.0000 [IU] | INTRAMUSCULAR | Status: DC

## 2020-11-06 ENCOUNTER — Inpatient Hospital Stay (HOSPITAL_COMMUNITY): Admission: RE | Admit: 2020-11-06 | Payer: Medicare Other | Source: Ambulatory Visit

## 2020-11-08 ENCOUNTER — Other Ambulatory Visit: Payer: Self-pay

## 2020-11-08 ENCOUNTER — Encounter (HOSPITAL_COMMUNITY)
Admission: RE | Admit: 2020-11-08 | Discharge: 2020-11-08 | Disposition: A | Discharge status: Home / Self Care | Payer: Medicare Other | Source: Ambulatory Visit | Attending: Nephrology | Admitting: Nephrology

## 2020-11-08 VITALS — BP 124/45 | HR 81 | Temp 98.3°F | Resp 20

## 2020-11-08 DIAGNOSIS — D638 Anemia in other chronic diseases classified elsewhere: Secondary | ICD-10-CM | POA: Diagnosis not present

## 2020-11-08 LAB — POCT HEMOGLOBIN-HEMACUE: Hemoglobin: 9.7 g/dL — ABNORMAL LOW (ref 12.0–15.0)

## 2020-11-08 MED ORDER — EPOETIN ALFA-EPBX 10000 UNIT/ML IJ SOLN
10000.0000 [IU] | INTRAMUSCULAR | Status: DC
Start: 2020-11-08 — End: 2020-11-09

## 2020-11-08 MED ORDER — EPOETIN ALFA-EPBX 10000 UNIT/ML IJ SOLN
INTRAMUSCULAR | Status: AC
  Administered 2020-11-08: 10000 [IU] via SUBCUTANEOUS
  Filled 2020-11-08: qty 1

## 2020-11-20 ENCOUNTER — Encounter (HOSPITAL_COMMUNITY): Payer: Medicare Other

## 2020-11-22 ENCOUNTER — Encounter (HOSPITAL_COMMUNITY): Payer: Medicare Other

## 2020-11-22 ENCOUNTER — Other Ambulatory Visit: Payer: Self-pay

## 2020-11-22 ENCOUNTER — Encounter (HOSPITAL_COMMUNITY)
Admission: RE | Admit: 2020-11-22 | Discharge: 2020-11-22 | Disposition: A | Discharge status: Home / Self Care | Payer: Medicare Other | Source: Ambulatory Visit | Attending: Nephrology | Admitting: Nephrology

## 2020-11-22 VITALS — BP 135/55 | HR 86 | Temp 97.6°F | Resp 20

## 2020-11-22 DIAGNOSIS — D638 Anemia in other chronic diseases classified elsewhere: Secondary | ICD-10-CM | POA: Diagnosis not present

## 2020-11-22 LAB — POCT HEMOGLOBIN-HEMACUE: Hemoglobin: 9.5 g/dL — ABNORMAL LOW (ref 12.0–15.0)

## 2020-11-22 LAB — FERRITIN: Ferritin: 1583 ng/mL — ABNORMAL HIGH (ref 11–307)

## 2020-11-22 LAB — IRON AND TIBC
Iron: 127 ug/dL (ref 28–170)
Saturation Ratios: 36 % — ABNORMAL HIGH (ref 10.4–31.8)
TIBC: 354 ug/dL (ref 250–450)
UIBC: 227 ug/dL

## 2020-11-22 MED ORDER — EPOETIN ALFA-EPBX 10000 UNIT/ML IJ SOLN
INTRAMUSCULAR | Status: AC
  Filled 2020-11-22: qty 1

## 2020-11-22 MED ORDER — EPOETIN ALFA-EPBX 10000 UNIT/ML IJ SOLN
10000.0000 [IU] | INTRAMUSCULAR | Status: DC
  Administered 2020-11-22: 10000 [IU] via SUBCUTANEOUS

## 2020-12-06 ENCOUNTER — Encounter (HOSPITAL_COMMUNITY): Payer: Medicare Other

## 2020-12-06 ENCOUNTER — Other Ambulatory Visit: Payer: Self-pay

## 2020-12-06 ENCOUNTER — Encounter (HOSPITAL_COMMUNITY)
Admission: RE | Admit: 2020-12-06 | Discharge: 2020-12-06 | Disposition: A | Discharge status: Home / Self Care | Payer: Medicare Other | Source: Ambulatory Visit | Attending: Nephrology | Admitting: Nephrology

## 2020-12-06 VITALS — BP 131/59 | HR 81 | Temp 98.0°F | Resp 20

## 2020-12-06 DIAGNOSIS — D638 Anemia in other chronic diseases classified elsewhere: Secondary | ICD-10-CM | POA: Diagnosis not present

## 2020-12-06 LAB — POCT HEMOGLOBIN-HEMACUE: Hemoglobin: 9.5 g/dL — ABNORMAL LOW (ref 12.0–15.0)

## 2020-12-06 MED ORDER — EPOETIN ALFA-EPBX 10000 UNIT/ML IJ SOLN
INTRAMUSCULAR | Status: AC
  Filled 2020-12-06: qty 1

## 2020-12-06 MED ORDER — EPOETIN ALFA-EPBX 10000 UNIT/ML IJ SOLN
10000.0000 [IU] | INTRAMUSCULAR | Status: DC
  Administered 2020-12-06: 10000 [IU] via SUBCUTANEOUS

## 2020-12-20 ENCOUNTER — Other Ambulatory Visit: Payer: Self-pay

## 2020-12-20 ENCOUNTER — Encounter (HOSPITAL_COMMUNITY)
Admission: RE | Admit: 2020-12-20 | Discharge: 2020-12-20 | Disposition: A | Discharge status: Home / Self Care | Payer: Medicare Other | Source: Ambulatory Visit | Attending: Nephrology | Admitting: Nephrology

## 2020-12-20 VITALS — BP 134/58 | HR 79 | Temp 97.5°F | Resp 20

## 2020-12-20 DIAGNOSIS — D638 Anemia in other chronic diseases classified elsewhere: Secondary | ICD-10-CM | POA: Diagnosis not present

## 2020-12-20 LAB — IRON AND TIBC
Iron: 103 ug/dL (ref 28–170)
Saturation Ratios: 30 % (ref 10.4–31.8)
TIBC: 342 ug/dL (ref 250–450)
UIBC: 239 ug/dL

## 2020-12-20 LAB — POCT HEMOGLOBIN-HEMACUE: Hemoglobin: 9.4 g/dL — ABNORMAL LOW (ref 12.0–15.0)

## 2020-12-20 LAB — FERRITIN: Ferritin: 1735 ng/mL — ABNORMAL HIGH (ref 11–307)

## 2020-12-20 MED ORDER — EPOETIN ALFA-EPBX 10000 UNIT/ML IJ SOLN
10000.0000 [IU] | INTRAMUSCULAR | Status: DC
  Administered 2020-12-20: 10000 [IU] via SUBCUTANEOUS

## 2020-12-20 MED ORDER — EPOETIN ALFA-EPBX 10000 UNIT/ML IJ SOLN
INTRAMUSCULAR | Status: AC
  Filled 2020-12-20: qty 1

## 2021-01-03 ENCOUNTER — Other Ambulatory Visit: Payer: Self-pay

## 2021-01-03 ENCOUNTER — Encounter (HOSPITAL_COMMUNITY)
Admission: RE | Admit: 2021-01-03 | Discharge: 2021-01-03 | Disposition: A | Discharge status: Home / Self Care | Payer: Medicare Other | Source: Ambulatory Visit | Attending: Nephrology | Admitting: Nephrology

## 2021-01-03 VITALS — BP 116/48 | HR 69 | Temp 97.8°F | Resp 20

## 2021-01-03 DIAGNOSIS — D638 Anemia in other chronic diseases classified elsewhere: Secondary | ICD-10-CM | POA: Diagnosis not present

## 2021-01-03 LAB — POCT HEMOGLOBIN-HEMACUE: Hemoglobin: 9.3 g/dL — ABNORMAL LOW (ref 12.0–15.0)

## 2021-01-03 MED ORDER — EPOETIN ALFA-EPBX 10000 UNIT/ML IJ SOLN: 10000.0000 [IU] | INTRAMUSCULAR | Status: DC

## 2021-01-03 MED ORDER — EPOETIN ALFA-EPBX 10000 UNIT/ML IJ SOLN
INTRAMUSCULAR | Status: AC
  Administered 2021-01-03: 10000 [IU] via SUBCUTANEOUS
  Filled 2021-01-03: qty 1

## 2021-01-17 ENCOUNTER — Other Ambulatory Visit: Payer: Self-pay

## 2021-01-17 ENCOUNTER — Encounter (HOSPITAL_COMMUNITY)
Admission: RE | Admit: 2021-01-17 | Discharge: 2021-01-17 | Disposition: A | Discharge status: Home / Self Care | Payer: Medicare Other | Source: Ambulatory Visit | Attending: Nephrology | Admitting: Nephrology

## 2021-01-17 VITALS — BP 123/61 | HR 65 | Resp 16

## 2021-01-17 DIAGNOSIS — D638 Anemia in other chronic diseases classified elsewhere: Secondary | ICD-10-CM

## 2021-01-17 LAB — POCT HEMOGLOBIN-HEMACUE: Hemoglobin: 9.1 g/dL — ABNORMAL LOW (ref 12.0–15.0)

## 2021-01-17 LAB — FERRITIN: Ferritin: 2065 ng/mL — ABNORMAL HIGH (ref 11–307)

## 2021-01-17 LAB — IRON AND TIBC
Iron: 124 ug/dL (ref 28–170)
Saturation Ratios: 37 % — ABNORMAL HIGH (ref 10.4–31.8)
TIBC: 335 ug/dL (ref 250–450)
UIBC: 211 ug/dL

## 2021-01-17 MED ORDER — EPOETIN ALFA-EPBX 10000 UNIT/ML IJ SOLN
INTRAMUSCULAR | Status: AC
  Administered 2021-01-17: 10000 [IU] via SUBCUTANEOUS
  Filled 2021-01-17: qty 1

## 2021-01-17 MED ORDER — EPOETIN ALFA-EPBX 10000 UNIT/ML IJ SOLN
10000.0000 [IU] | INTRAMUSCULAR | Status: DC
Start: 2021-01-17 — End: 2021-01-18

## 2021-01-30 ENCOUNTER — Other Ambulatory Visit (HOSPITAL_COMMUNITY): Payer: Self-pay

## 2021-01-31 ENCOUNTER — Encounter (HOSPITAL_COMMUNITY)
Admission: RE | Admit: 2021-01-31 | Discharge: 2021-01-31 | Disposition: A | Discharge status: Home / Self Care | Payer: Medicare Other | Source: Ambulatory Visit | Attending: Nephrology | Admitting: Nephrology

## 2021-01-31 ENCOUNTER — Other Ambulatory Visit: Payer: Self-pay

## 2021-01-31 VITALS — BP 113/59 | HR 75 | Temp 97.8°F | Resp 20

## 2021-01-31 DIAGNOSIS — D638 Anemia in other chronic diseases classified elsewhere: Secondary | ICD-10-CM | POA: Diagnosis not present

## 2021-01-31 LAB — POCT HEMOGLOBIN-HEMACUE: Hemoglobin: 9.3 g/dL — ABNORMAL LOW (ref 12.0–15.0)

## 2021-01-31 MED ORDER — EPOETIN ALFA-EPBX 10000 UNIT/ML IJ SOLN
INTRAMUSCULAR | Status: AC
  Filled 2021-01-31: qty 1

## 2021-01-31 MED ORDER — EPOETIN ALFA-EPBX 10000 UNIT/ML IJ SOLN
10000.0000 [IU] | INTRAMUSCULAR | Status: DC
  Administered 2021-01-31: 10000 [IU] via SUBCUTANEOUS

## 2021-02-07 ENCOUNTER — Encounter (HOSPITAL_COMMUNITY): Payer: Medicare Other

## 2021-02-14 ENCOUNTER — Encounter (HOSPITAL_COMMUNITY)
Admission: RE | Admit: 2021-02-14 | Discharge: 2021-02-14 | Disposition: A | Discharge status: Home / Self Care | Payer: Medicare Other | Source: Ambulatory Visit | Attending: Nephrology | Admitting: Nephrology

## 2021-02-14 ENCOUNTER — Other Ambulatory Visit: Payer: Self-pay

## 2021-02-14 VITALS — BP 121/41 | HR 67 | Temp 97.6°F | Resp 20

## 2021-02-14 DIAGNOSIS — D638 Anemia in other chronic diseases classified elsewhere: Secondary | ICD-10-CM | POA: Diagnosis not present

## 2021-02-14 LAB — IRON AND TIBC
Iron: 146 ug/dL (ref 28–170)
Saturation Ratios: 42 % — ABNORMAL HIGH (ref 10.4–31.8)
TIBC: 349 ug/dL (ref 250–450)
UIBC: 203 ug/dL

## 2021-02-14 LAB — POCT HEMOGLOBIN-HEMACUE: Hemoglobin: 9.3 g/dL — ABNORMAL LOW (ref 12.0–15.0)

## 2021-02-14 LAB — FERRITIN: Ferritin: 1905 ng/mL — ABNORMAL HIGH (ref 11–307)

## 2021-02-14 MED ORDER — EPOETIN ALFA-EPBX 10000 UNIT/ML IJ SOLN
INTRAMUSCULAR | Status: AC
  Filled 2021-02-14: qty 2

## 2021-02-14 MED ORDER — EPOETIN ALFA-EPBX 10000 UNIT/ML IJ SOLN
20000.0000 [IU] | Freq: Once | INTRAMUSCULAR | Status: AC
  Administered 2021-02-14: 20000 [IU] via SUBCUTANEOUS

## 2021-02-14 MED ORDER — EPOETIN ALFA-EPBX 10000 UNIT/ML IJ SOLN: 10000.0000 [IU] | INTRAMUSCULAR | Status: DC

## 2021-02-28 ENCOUNTER — Encounter (HOSPITAL_COMMUNITY)
Admission: RE | Admit: 2021-02-28 | Discharge: 2021-02-28 | Disposition: A | Discharge status: Home / Self Care | Payer: Medicare Other | Source: Ambulatory Visit | Attending: Nephrology | Admitting: Nephrology

## 2021-02-28 ENCOUNTER — Other Ambulatory Visit: Payer: Self-pay

## 2021-02-28 DIAGNOSIS — D638 Anemia in other chronic diseases classified elsewhere: Secondary | ICD-10-CM | POA: Diagnosis not present

## 2021-02-28 MED ORDER — EPOETIN ALFA-EPBX 10000 UNIT/ML IJ SOLN
INTRAMUSCULAR | Status: AC
  Administered 2021-02-28: 20000 [IU]
  Filled 2021-02-28: qty 2

## 2021-03-06 LAB — POCT HEMOGLOBIN-HEMACUE: Hemoglobin: 9.1 g/dL — ABNORMAL LOW (ref 12.0–15.0)

## 2021-03-14 ENCOUNTER — Encounter (HOSPITAL_COMMUNITY)
Admission: RE | Admit: 2021-03-14 | Discharge: 2021-03-14 | Disposition: A | Discharge status: Home / Self Care | Payer: Medicare Other | Source: Ambulatory Visit | Attending: Nephrology | Admitting: Nephrology

## 2021-03-14 ENCOUNTER — Other Ambulatory Visit: Payer: Self-pay

## 2021-03-14 VITALS — BP 130/61 | HR 73 | Temp 97.6°F | Resp 20

## 2021-03-14 DIAGNOSIS — D638 Anemia in other chronic diseases classified elsewhere: Secondary | ICD-10-CM

## 2021-03-14 LAB — IRON AND TIBC
Iron: 134 ug/dL (ref 28–170)
Saturation Ratios: 38 % — ABNORMAL HIGH (ref 10.4–31.8)
TIBC: 349 ug/dL (ref 250–450)
UIBC: 215 ug/dL

## 2021-03-14 LAB — FERRITIN: Ferritin: 1673 ng/mL — ABNORMAL HIGH (ref 11–307)

## 2021-03-14 LAB — POCT HEMOGLOBIN-HEMACUE: Hemoglobin: 9.7 g/dL — ABNORMAL LOW (ref 12.0–15.0)

## 2021-03-14 MED ORDER — EPOETIN ALFA-EPBX 10000 UNIT/ML IJ SOLN: 20000.0000 [IU] | INTRAMUSCULAR | Status: DC

## 2021-03-14 MED ORDER — EPOETIN ALFA-EPBX 10000 UNIT/ML IJ SOLN
INTRAMUSCULAR | Status: AC
  Administered 2021-03-14: 20000 [IU] via SUBCUTANEOUS
  Filled 2021-03-14: qty 2

## 2021-03-28 ENCOUNTER — Other Ambulatory Visit: Payer: Self-pay

## 2021-03-28 ENCOUNTER — Encounter (HOSPITAL_COMMUNITY)
Admission: RE | Admit: 2021-03-28 | Discharge: 2021-03-28 | Disposition: A | Discharge status: Home / Self Care | Payer: Medicare Other | Source: Ambulatory Visit | Attending: Nephrology | Admitting: Nephrology

## 2021-03-28 VITALS — BP 133/51 | HR 73 | Temp 98.4°F | Resp 18

## 2021-03-28 DIAGNOSIS — D638 Anemia in other chronic diseases classified elsewhere: Secondary | ICD-10-CM | POA: Insufficient documentation

## 2021-03-28 LAB — POCT HEMOGLOBIN-HEMACUE: Hemoglobin: 9.2 g/dL — ABNORMAL LOW (ref 12.0–15.0)

## 2021-03-28 MED ORDER — EPOETIN ALFA-EPBX 10000 UNIT/ML IJ SOLN: 20000.0000 [IU] | INTRAMUSCULAR | Status: DC

## 2021-03-28 MED ORDER — EPOETIN ALFA-EPBX 10000 UNIT/ML IJ SOLN
INTRAMUSCULAR | Status: AC
  Administered 2021-03-28: 20000 [IU] via SUBCUTANEOUS
  Filled 2021-03-28: qty 2

## 2021-04-11 ENCOUNTER — Other Ambulatory Visit: Payer: Self-pay

## 2021-04-11 ENCOUNTER — Encounter (HOSPITAL_COMMUNITY)
Admission: RE | Admit: 2021-04-11 | Discharge: 2021-04-11 | Disposition: A | Discharge status: Home / Self Care | Payer: Medicare Other | Source: Ambulatory Visit | Attending: Nephrology | Admitting: Nephrology

## 2021-04-11 VITALS — BP 123/59 | HR 66

## 2021-04-11 DIAGNOSIS — D638 Anemia in other chronic diseases classified elsewhere: Secondary | ICD-10-CM | POA: Diagnosis not present

## 2021-04-11 LAB — IRON AND TIBC
Iron: 95 ug/dL (ref 28–170)
Saturation Ratios: 27 % (ref 10.4–31.8)
TIBC: 351 ug/dL (ref 250–450)
UIBC: 256 ug/dL

## 2021-04-11 LAB — POCT HEMOGLOBIN-HEMACUE: Hemoglobin: 9.4 g/dL — ABNORMAL LOW (ref 12.0–15.0)

## 2021-04-11 LAB — FERRITIN: Ferritin: 1716 ng/mL — ABNORMAL HIGH (ref 11–307)

## 2021-04-11 MED ORDER — EPOETIN ALFA-EPBX 10000 UNIT/ML IJ SOLN
20000.0000 [IU] | INTRAMUSCULAR | Status: DC
  Administered 2021-04-11: 20000 [IU] via SUBCUTANEOUS

## 2021-04-11 MED ORDER — EPOETIN ALFA-EPBX 10000 UNIT/ML IJ SOLN
INTRAMUSCULAR | Status: AC
  Filled 2021-04-11: qty 2

## 2021-04-25 ENCOUNTER — Other Ambulatory Visit: Payer: Self-pay

## 2021-04-25 ENCOUNTER — Encounter (HOSPITAL_COMMUNITY)
Admission: RE | Admit: 2021-04-25 | Discharge: 2021-04-25 | Disposition: A | Discharge status: Home / Self Care | Payer: Medicare Other | Source: Ambulatory Visit | Attending: Nephrology | Admitting: Nephrology

## 2021-04-25 VITALS — BP 145/60 | HR 74 | Temp 98.4°F | Resp 20

## 2021-04-25 DIAGNOSIS — D638 Anemia in other chronic diseases classified elsewhere: Secondary | ICD-10-CM | POA: Diagnosis not present

## 2021-04-25 MED ORDER — EPOETIN ALFA-EPBX 10000 UNIT/ML IJ SOLN
INTRAMUSCULAR | Status: AC
  Filled 2021-04-25: qty 2

## 2021-04-25 MED ORDER — EPOETIN ALFA-EPBX 10000 UNIT/ML IJ SOLN
20000.0000 [IU] | INTRAMUSCULAR | Status: DC
  Administered 2021-04-25: 20000 [IU] via SUBCUTANEOUS

## 2021-04-26 LAB — POCT HEMOGLOBIN-HEMACUE: Hemoglobin: 9.8 g/dL — ABNORMAL LOW (ref 12.0–15.0)

## 2021-05-09 ENCOUNTER — Encounter (HOSPITAL_COMMUNITY)
Admission: RE | Admit: 2021-05-09 | Discharge: 2021-05-09 | Disposition: A | Discharge status: Home / Self Care | Payer: Medicare Other | Source: Ambulatory Visit | Attending: Nephrology | Admitting: Nephrology

## 2021-05-09 ENCOUNTER — Other Ambulatory Visit: Payer: Self-pay

## 2021-05-09 VITALS — BP 124/66 | HR 73

## 2021-05-09 DIAGNOSIS — D638 Anemia in other chronic diseases classified elsewhere: Secondary | ICD-10-CM

## 2021-05-09 LAB — IRON AND TIBC
Iron: 106 ug/dL (ref 28–170)
Saturation Ratios: 32 % — ABNORMAL HIGH (ref 10.4–31.8)
TIBC: 332 ug/dL (ref 250–450)
UIBC: 226 ug/dL

## 2021-05-09 LAB — POCT HEMOGLOBIN-HEMACUE: Hemoglobin: 9.4 g/dL — ABNORMAL LOW (ref 12.0–15.0)

## 2021-05-09 LAB — FERRITIN: Ferritin: 1845 ng/mL — ABNORMAL HIGH (ref 11–307)

## 2021-05-09 MED ORDER — EPOETIN ALFA-EPBX 10000 UNIT/ML IJ SOLN
INTRAMUSCULAR | Status: AC
  Filled 2021-05-09: qty 2

## 2021-05-09 MED ORDER — EPOETIN ALFA-EPBX 10000 UNIT/ML IJ SOLN
20000.0000 [IU] | INTRAMUSCULAR | Status: DC
  Administered 2021-05-09: 20000 [IU] via SUBCUTANEOUS

## 2021-05-23 ENCOUNTER — Encounter (HOSPITAL_COMMUNITY)
Admission: RE | Admit: 2021-05-23 | Discharge: 2021-05-23 | Disposition: A | Discharge status: Home / Self Care | Payer: Medicare Other | Source: Ambulatory Visit | Attending: Nephrology | Admitting: Nephrology

## 2021-05-23 ENCOUNTER — Other Ambulatory Visit: Payer: Self-pay

## 2021-05-23 DIAGNOSIS — D638 Anemia in other chronic diseases classified elsewhere: Secondary | ICD-10-CM | POA: Insufficient documentation

## 2021-05-23 LAB — POCT HEMOGLOBIN-HEMACUE: Hemoglobin: 9.5 g/dL — ABNORMAL LOW (ref 12.0–15.0)

## 2021-05-23 MED ORDER — EPOETIN ALFA-EPBX 10000 UNIT/ML IJ SOLN
INTRAMUSCULAR | Status: AC
  Administered 2021-05-23: 20000 [IU] via SUBCUTANEOUS
  Filled 2021-05-23: qty 2

## 2021-05-23 MED ORDER — EPOETIN ALFA-EPBX 10000 UNIT/ML IJ SOLN
20000.0000 [IU] | Freq: Once | INTRAMUSCULAR | Status: AC
Start: 2021-05-23 — End: 2021-05-23

## 2021-06-03 DIAGNOSIS — R350 Frequency of micturition: Secondary | ICD-10-CM | POA: Diagnosis not present

## 2021-06-03 DIAGNOSIS — I129 Hypertensive chronic kidney disease with stage 1 through stage 4 chronic kidney disease, or unspecified chronic kidney disease: Secondary | ICD-10-CM | POA: Diagnosis not present

## 2021-06-03 DIAGNOSIS — G2581 Restless legs syndrome: Secondary | ICD-10-CM | POA: Diagnosis not present

## 2021-06-03 DIAGNOSIS — N184 Chronic kidney disease, stage 4 (severe): Secondary | ICD-10-CM | POA: Diagnosis not present

## 2021-06-03 DIAGNOSIS — E039 Hypothyroidism, unspecified: Secondary | ICD-10-CM | POA: Diagnosis not present

## 2021-06-03 DIAGNOSIS — I10 Essential (primary) hypertension: Secondary | ICD-10-CM | POA: Diagnosis not present

## 2021-06-03 DIAGNOSIS — D631 Anemia in chronic kidney disease: Secondary | ICD-10-CM | POA: Diagnosis not present

## 2021-06-06 ENCOUNTER — Encounter (HOSPITAL_COMMUNITY)
Admission: RE | Admit: 2021-06-06 | Discharge: 2021-06-06 | Disposition: A | Discharge status: Home / Self Care | Payer: Medicare Other | Source: Ambulatory Visit | Attending: Nephrology | Admitting: Nephrology

## 2021-06-06 ENCOUNTER — Other Ambulatory Visit: Payer: Self-pay

## 2021-06-06 VITALS — BP 123/60 | HR 82 | Temp 98.5°F | Resp 18

## 2021-06-06 DIAGNOSIS — D638 Anemia in other chronic diseases classified elsewhere: Secondary | ICD-10-CM | POA: Diagnosis not present

## 2021-06-06 LAB — IRON AND TIBC
Iron: 154 ug/dL (ref 28–170)
Saturation Ratios: 45 % — ABNORMAL HIGH (ref 10.4–31.8)
TIBC: 340 ug/dL (ref 250–450)
UIBC: 186 ug/dL

## 2021-06-06 LAB — FERRITIN: Ferritin: 1868 ng/mL — ABNORMAL HIGH (ref 11–307)

## 2021-06-06 LAB — POCT HEMOGLOBIN-HEMACUE: Hemoglobin: 9.9 g/dL — ABNORMAL LOW (ref 12.0–15.0)

## 2021-06-06 MED ORDER — EPOETIN ALFA-EPBX 10000 UNIT/ML IJ SOLN: 20000.0000 [IU] | INTRAMUSCULAR | Status: DC

## 2021-06-06 MED ORDER — EPOETIN ALFA-EPBX 10000 UNIT/ML IJ SOLN
INTRAMUSCULAR | Status: AC
  Administered 2021-06-06: 20000 [IU] via SUBCUTANEOUS
  Filled 2021-06-06: qty 2

## 2021-06-20 ENCOUNTER — Encounter (HOSPITAL_COMMUNITY)
Admission: RE | Admit: 2021-06-20 | Discharge: 2021-06-20 | Disposition: A | Discharge status: Home / Self Care | Payer: Medicare Other | Source: Ambulatory Visit | Attending: Nephrology | Admitting: Nephrology

## 2021-06-20 ENCOUNTER — Other Ambulatory Visit: Payer: Self-pay

## 2021-06-20 VITALS — BP 127/51 | HR 65 | Temp 98.4°F | Resp 18

## 2021-06-20 DIAGNOSIS — D638 Anemia in other chronic diseases classified elsewhere: Secondary | ICD-10-CM | POA: Insufficient documentation

## 2021-06-20 LAB — POCT HEMOGLOBIN-HEMACUE: Hemoglobin: 8.8 g/dL — ABNORMAL LOW (ref 12.0–15.0)

## 2021-06-20 MED ORDER — EPOETIN ALFA-EPBX 10000 UNIT/ML IJ SOLN: 20000.0000 [IU] | INTRAMUSCULAR | Status: DC

## 2021-06-20 MED ORDER — EPOETIN ALFA-EPBX 10000 UNIT/ML IJ SOLN
INTRAMUSCULAR | Status: AC
  Administered 2021-06-20: 20000 [IU] via SUBCUTANEOUS
  Filled 2021-06-20: qty 2

## 2021-07-04 ENCOUNTER — Encounter (HOSPITAL_COMMUNITY)
Admission: RE | Admit: 2021-07-04 | Discharge: 2021-07-04 | Disposition: A | Discharge status: Home / Self Care | Payer: Medicare Other | Source: Ambulatory Visit | Attending: Nephrology | Admitting: Nephrology

## 2021-07-04 VITALS — BP 137/62 | HR 79 | Temp 99.5°F | Resp 18

## 2021-07-04 DIAGNOSIS — D638 Anemia in other chronic diseases classified elsewhere: Secondary | ICD-10-CM

## 2021-07-04 LAB — IRON AND TIBC
Iron: 106 ug/dL (ref 28–170)
Saturation Ratios: 32 % — ABNORMAL HIGH (ref 10.4–31.8)
TIBC: 329 ug/dL (ref 250–450)
UIBC: 223 ug/dL

## 2021-07-04 LAB — FERRITIN: Ferritin: 1859 ng/mL — ABNORMAL HIGH (ref 11–307)

## 2021-07-04 LAB — POCT HEMOGLOBIN-HEMACUE: Hemoglobin: 9.3 g/dL — ABNORMAL LOW (ref 12.0–15.0)

## 2021-07-04 MED ORDER — EPOETIN ALFA-EPBX 10000 UNIT/ML IJ SOLN: 20000.0000 [IU] | INTRAMUSCULAR | Status: DC

## 2021-07-04 MED ORDER — EPOETIN ALFA-EPBX 10000 UNIT/ML IJ SOLN
INTRAMUSCULAR | Status: AC
  Administered 2021-07-04: 20000 [IU] via SUBCUTANEOUS
  Filled 2021-07-04: qty 2

## 2021-07-10 ENCOUNTER — Encounter (HOSPITAL_COMMUNITY): Payer: Medicare Other

## 2021-07-17 ENCOUNTER — Other Ambulatory Visit (HOSPITAL_COMMUNITY): Payer: Self-pay | Admitting: *Deleted

## 2021-07-18 ENCOUNTER — Encounter (HOSPITAL_COMMUNITY)
Admission: RE | Admit: 2021-07-18 | Discharge: 2021-07-18 | Disposition: A | Discharge status: Home / Self Care | Payer: Medicare Other | Source: Ambulatory Visit | Attending: Nephrology | Admitting: Nephrology

## 2021-07-18 VITALS — BP 121/54 | HR 77 | Temp 97.6°F | Resp 18

## 2021-07-18 DIAGNOSIS — D638 Anemia in other chronic diseases classified elsewhere: Secondary | ICD-10-CM | POA: Diagnosis not present

## 2021-07-18 LAB — POCT HEMOGLOBIN-HEMACUE: Hemoglobin: 9.3 g/dL — ABNORMAL LOW (ref 12.0–15.0)

## 2021-07-18 MED ORDER — EPOETIN ALFA-EPBX 10000 UNIT/ML IJ SOLN
INTRAMUSCULAR | Status: AC
  Filled 2021-07-18: qty 2

## 2021-07-18 MED ORDER — EPOETIN ALFA-EPBX 10000 UNIT/ML IJ SOLN
20000.0000 [IU] | INTRAMUSCULAR | Status: DC
  Administered 2021-07-18: 20000 [IU] via SUBCUTANEOUS

## 2021-08-01 ENCOUNTER — Encounter (HOSPITAL_COMMUNITY)
Admission: RE | Admit: 2021-08-01 | Discharge: 2021-08-01 | Disposition: A | Discharge status: Home / Self Care | Payer: Medicare Other | Source: Ambulatory Visit | Attending: Nephrology | Admitting: Nephrology

## 2021-08-01 VITALS — BP 116/50 | HR 79 | Temp 98.3°F | Resp 18

## 2021-08-01 DIAGNOSIS — D638 Anemia in other chronic diseases classified elsewhere: Secondary | ICD-10-CM | POA: Insufficient documentation

## 2021-08-01 LAB — FERRITIN: Ferritin: 1945 ng/mL — ABNORMAL HIGH (ref 11–307)

## 2021-08-01 LAB — POCT HEMOGLOBIN-HEMACUE: Hemoglobin: 9.6 g/dL — ABNORMAL LOW (ref 12.0–15.0)

## 2021-08-01 LAB — IRON AND TIBC
Iron: 122 ug/dL (ref 28–170)
Saturation Ratios: 35 % — ABNORMAL HIGH (ref 10.4–31.8)
TIBC: 347 ug/dL (ref 250–450)
UIBC: 225 ug/dL

## 2021-08-01 MED ORDER — EPOETIN ALFA-EPBX 10000 UNIT/ML IJ SOLN: 20000.0000 [IU] | INTRAMUSCULAR | Status: DC

## 2021-08-01 MED ORDER — EPOETIN ALFA-EPBX 10000 UNIT/ML IJ SOLN
INTRAMUSCULAR | Status: AC
  Administered 2021-08-01: 20000 [IU] via SUBCUTANEOUS
  Filled 2021-08-01: qty 2

## 2021-08-06 DIAGNOSIS — Z0000 Encounter for general adult medical examination without abnormal findings: Secondary | ICD-10-CM | POA: Diagnosis not present

## 2021-08-06 DIAGNOSIS — Z23 Encounter for immunization: Secondary | ICD-10-CM | POA: Diagnosis not present

## 2021-08-06 DIAGNOSIS — H9190 Unspecified hearing loss, unspecified ear: Secondary | ICD-10-CM | POA: Diagnosis not present

## 2021-08-06 DIAGNOSIS — N184 Chronic kidney disease, stage 4 (severe): Secondary | ICD-10-CM | POA: Diagnosis not present

## 2021-08-06 DIAGNOSIS — M1990 Unspecified osteoarthritis, unspecified site: Secondary | ICD-10-CM | POA: Diagnosis not present

## 2021-08-06 DIAGNOSIS — M810 Age-related osteoporosis without current pathological fracture: Secondary | ICD-10-CM | POA: Diagnosis not present

## 2021-08-15 ENCOUNTER — Other Ambulatory Visit: Payer: Self-pay

## 2021-08-15 ENCOUNTER — Encounter (HOSPITAL_COMMUNITY)
Admission: RE | Admit: 2021-08-15 | Discharge: 2021-08-15 | Disposition: A | Discharge status: Home / Self Care | Payer: Medicare Other | Source: Ambulatory Visit | Attending: Nephrology | Admitting: Nephrology

## 2021-08-15 VITALS — BP 121/61 | HR 79 | Temp 97.7°F | Resp 18

## 2021-08-15 DIAGNOSIS — D638 Anemia in other chronic diseases classified elsewhere: Secondary | ICD-10-CM

## 2021-08-15 LAB — POCT HEMOGLOBIN-HEMACUE: Hemoglobin: 10.1 g/dL — ABNORMAL LOW (ref 12.0–15.0)

## 2021-08-15 MED ORDER — EPOETIN ALFA-EPBX 10000 UNIT/ML IJ SOLN
INTRAMUSCULAR | Status: AC
  Filled 2021-08-15: qty 2

## 2021-08-15 MED ORDER — EPOETIN ALFA-EPBX 10000 UNIT/ML IJ SOLN
20000.0000 [IU] | INTRAMUSCULAR | Status: DC
  Administered 2021-08-15: 20000 [IU] via SUBCUTANEOUS

## 2021-08-29 ENCOUNTER — Encounter (HOSPITAL_COMMUNITY)
Admission: RE | Admit: 2021-08-29 | Discharge: 2021-08-29 | Disposition: A | Discharge status: Home / Self Care | Payer: Medicare Other | Source: Ambulatory Visit | Attending: Nephrology | Admitting: Nephrology

## 2021-08-29 ENCOUNTER — Other Ambulatory Visit: Payer: Self-pay

## 2021-08-29 VITALS — BP 122/60 | HR 91 | Temp 97.4°F | Resp 18

## 2021-08-29 DIAGNOSIS — D638 Anemia in other chronic diseases classified elsewhere: Secondary | ICD-10-CM | POA: Diagnosis not present

## 2021-08-29 LAB — IRON AND TIBC
Iron: 146 ug/dL (ref 28–170)
Saturation Ratios: 41 % — ABNORMAL HIGH (ref 10.4–31.8)
TIBC: 360 ug/dL (ref 250–450)
UIBC: 214 ug/dL

## 2021-08-29 LAB — FERRITIN: Ferritin: 1510 ng/mL — ABNORMAL HIGH (ref 11–307)

## 2021-08-29 LAB — POCT HEMOGLOBIN-HEMACUE: Hemoglobin: 10 g/dL — ABNORMAL LOW (ref 12.0–15.0)

## 2021-08-29 MED ORDER — EPOETIN ALFA-EPBX 10000 UNIT/ML IJ SOLN
20000.0000 [IU] | INTRAMUSCULAR | Status: DC
  Administered 2021-08-29: 20000 [IU] via SUBCUTANEOUS

## 2021-08-29 MED ORDER — EPOETIN ALFA-EPBX 10000 UNIT/ML IJ SOLN
INTRAMUSCULAR | Status: AC
  Filled 2021-08-29: qty 2

## 2021-09-13 ENCOUNTER — Encounter (HOSPITAL_COMMUNITY)
Admission: RE | Admit: 2021-09-13 | Discharge: 2021-09-13 | Disposition: A | Discharge status: Home / Self Care | Payer: Medicare Other | Source: Ambulatory Visit | Attending: Nephrology | Admitting: Nephrology

## 2021-09-13 ENCOUNTER — Other Ambulatory Visit: Payer: Self-pay

## 2021-09-13 VITALS — BP 127/47 | HR 80 | Temp 97.5°F | Resp 18

## 2021-09-13 DIAGNOSIS — D638 Anemia in other chronic diseases classified elsewhere: Secondary | ICD-10-CM | POA: Diagnosis not present

## 2021-09-13 LAB — POCT HEMOGLOBIN-HEMACUE: Hemoglobin: 9.8 g/dL — ABNORMAL LOW (ref 12.0–15.0)

## 2021-09-13 MED ORDER — EPOETIN ALFA-EPBX 10000 UNIT/ML IJ SOLN
20000.0000 [IU] | INTRAMUSCULAR | Status: DC
  Administered 2021-09-13: 20000 [IU] via SUBCUTANEOUS

## 2021-09-13 MED ORDER — EPOETIN ALFA-EPBX 10000 UNIT/ML IJ SOLN
INTRAMUSCULAR | Status: AC
  Filled 2021-09-13: qty 2

## 2021-09-27 ENCOUNTER — Encounter (HOSPITAL_COMMUNITY)
Admission: RE | Admit: 2021-09-27 | Discharge: 2021-09-27 | Disposition: A | Discharge status: Home / Self Care | Payer: Medicare Other | Source: Ambulatory Visit | Attending: Nephrology | Admitting: Nephrology

## 2021-09-27 VITALS — BP 129/50 | HR 78 | Temp 98.0°F | Resp 18

## 2021-09-27 DIAGNOSIS — D638 Anemia in other chronic diseases classified elsewhere: Secondary | ICD-10-CM | POA: Insufficient documentation

## 2021-09-27 LAB — IRON AND TIBC
Iron: 127 ug/dL (ref 28–170)
Saturation Ratios: 35 % — ABNORMAL HIGH (ref 10.4–31.8)
TIBC: 365 ug/dL (ref 250–450)
UIBC: 238 ug/dL

## 2021-09-27 LAB — FERRITIN: Ferritin: 1723 ng/mL — ABNORMAL HIGH (ref 11–307)

## 2021-09-27 LAB — POCT HEMOGLOBIN-HEMACUE: Hemoglobin: 9.1 g/dL — ABNORMAL LOW (ref 12.0–15.0)

## 2021-09-27 MED ORDER — EPOETIN ALFA-EPBX 10000 UNIT/ML IJ SOLN: 20000.0000 [IU] | INTRAMUSCULAR | Status: DC

## 2021-09-27 MED ORDER — EPOETIN ALFA-EPBX 10000 UNIT/ML IJ SOLN
INTRAMUSCULAR | Status: AC
  Administered 2021-09-27: 20000 [IU] via SUBCUTANEOUS
  Filled 2021-09-27: qty 2

## 2021-10-11 ENCOUNTER — Other Ambulatory Visit: Payer: Self-pay

## 2021-10-11 ENCOUNTER — Encounter (HOSPITAL_COMMUNITY)
Admission: RE | Admit: 2021-10-11 | Discharge: 2021-10-11 | Disposition: A | Discharge status: Home / Self Care | Payer: Medicare Other | Source: Ambulatory Visit | Attending: Nephrology | Admitting: Nephrology

## 2021-10-11 VITALS — BP 153/60 | HR 98 | Temp 97.5°F

## 2021-10-11 DIAGNOSIS — D638 Anemia in other chronic diseases classified elsewhere: Secondary | ICD-10-CM | POA: Diagnosis not present

## 2021-10-11 LAB — POCT HEMOGLOBIN-HEMACUE: Hemoglobin: 10.1 g/dL — ABNORMAL LOW (ref 12.0–15.0)

## 2021-10-11 MED ORDER — EPOETIN ALFA-EPBX 10000 UNIT/ML IJ SOLN
20000.0000 [IU] | INTRAMUSCULAR | Status: DC
  Administered 2021-10-11: 10:00:00 20000 [IU] via SUBCUTANEOUS

## 2021-10-11 MED ORDER — EPOETIN ALFA-EPBX 10000 UNIT/ML IJ SOLN
INTRAMUSCULAR | Status: AC
  Filled 2021-10-11: qty 2

## 2021-10-25 ENCOUNTER — Other Ambulatory Visit: Payer: Self-pay

## 2021-10-25 ENCOUNTER — Encounter (HOSPITAL_COMMUNITY)
Admission: RE | Admit: 2021-10-25 | Discharge: 2021-10-25 | Disposition: A | Discharge status: Home / Self Care | Payer: Medicare Other | Source: Ambulatory Visit | Attending: Nephrology | Admitting: Nephrology

## 2021-10-25 VITALS — BP 144/46 | HR 77 | Temp 97.4°F | Resp 18 | Wt 99.0 lb

## 2021-10-25 DIAGNOSIS — D638 Anemia in other chronic diseases classified elsewhere: Secondary | ICD-10-CM | POA: Diagnosis not present

## 2021-10-25 LAB — IRON AND TIBC
Iron: 116 ug/dL (ref 28–170)
Saturation Ratios: 36 % — ABNORMAL HIGH (ref 10.4–31.8)
TIBC: 319 ug/dL (ref 250–450)
UIBC: 203 ug/dL

## 2021-10-25 LAB — POCT HEMOGLOBIN-HEMACUE: Hemoglobin: 8.8 g/dL — ABNORMAL LOW (ref 12.0–15.0)

## 2021-10-25 LAB — FERRITIN: Ferritin: 1516 ng/mL — ABNORMAL HIGH (ref 11–307)

## 2021-10-25 MED ORDER — EPOETIN ALFA-EPBX 10000 UNIT/ML IJ SOLN: 20000.0000 [IU] | INTRAMUSCULAR | Status: DC

## 2021-10-25 MED ORDER — EPOETIN ALFA-EPBX 10000 UNIT/ML IJ SOLN
INTRAMUSCULAR | Status: AC
  Administered 2021-10-25: 20000 [IU] via SUBCUTANEOUS
  Filled 2021-10-25: qty 2

## 2021-11-01 IMAGING — DX DG KNEE COMPLETE 4+V*L*
4 series · 4 of 4 positions shown · non-contrast
Comparison: None.

CLINICAL DATA: Knee pain

EXAM:
LEFT KNEE - COMPLETE 4+ VIEW

[knee ap]
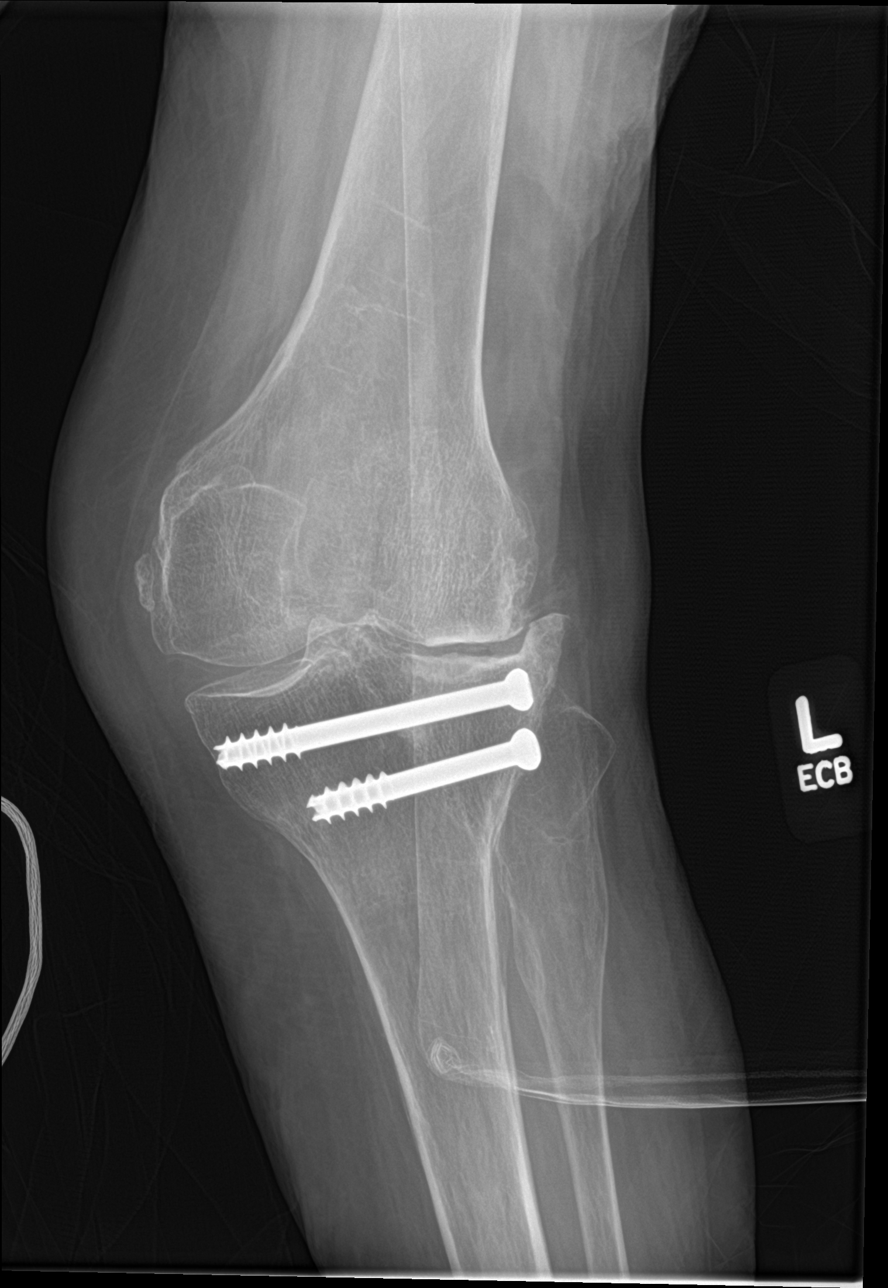

[knee lat]
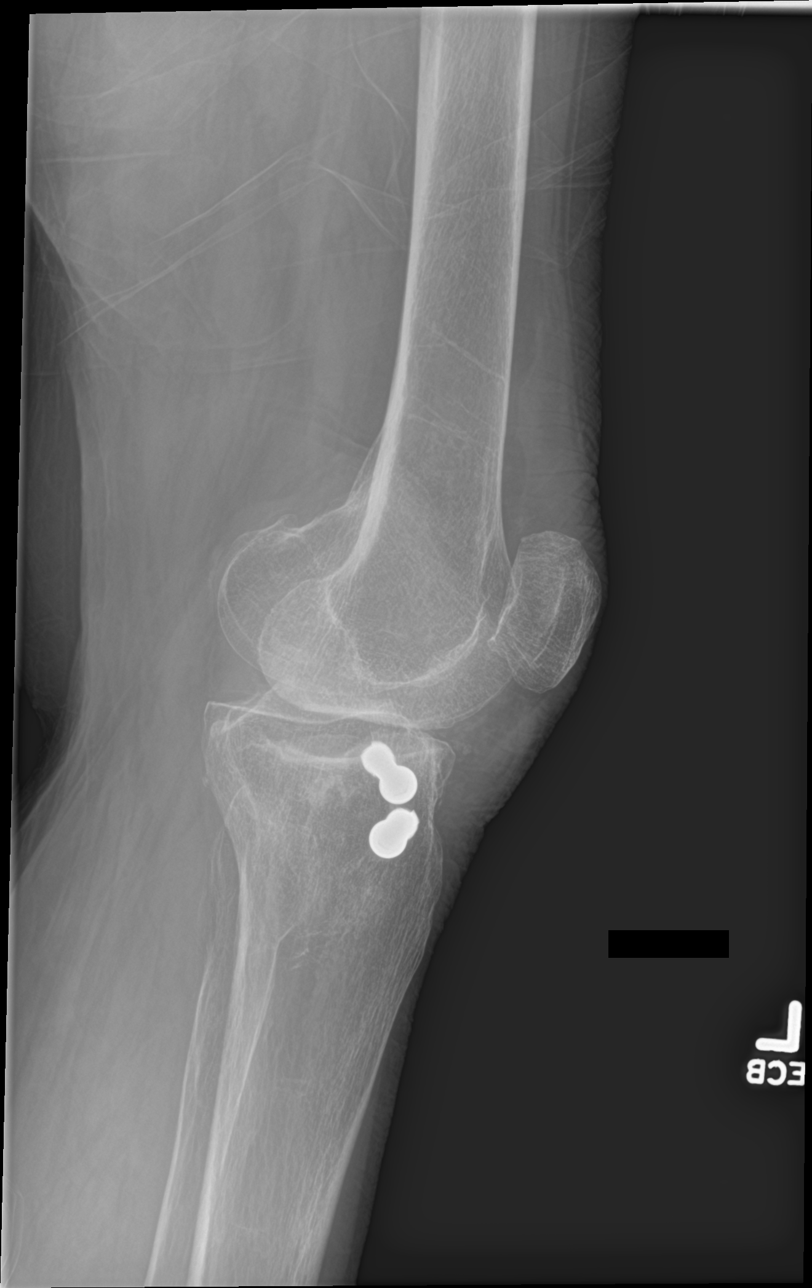

[knee obl (1 of 2)]
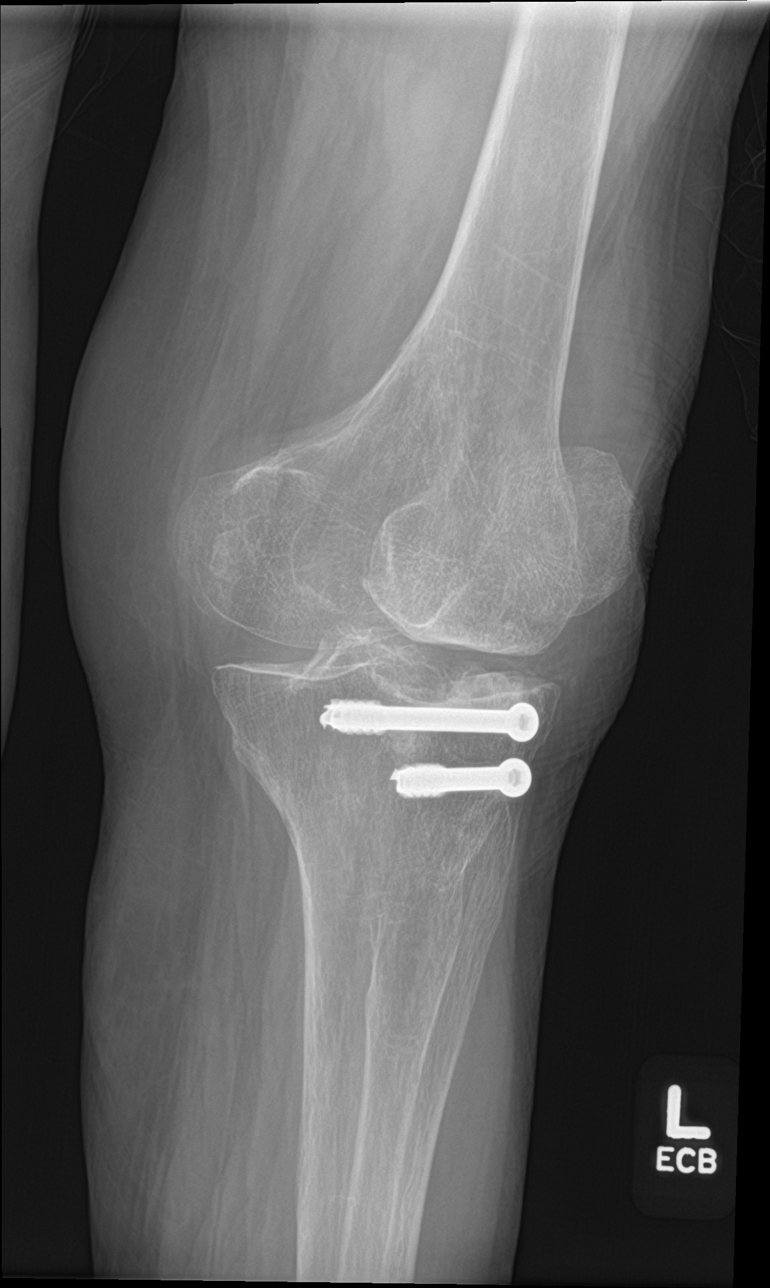

[knee obl (2 of 2)]
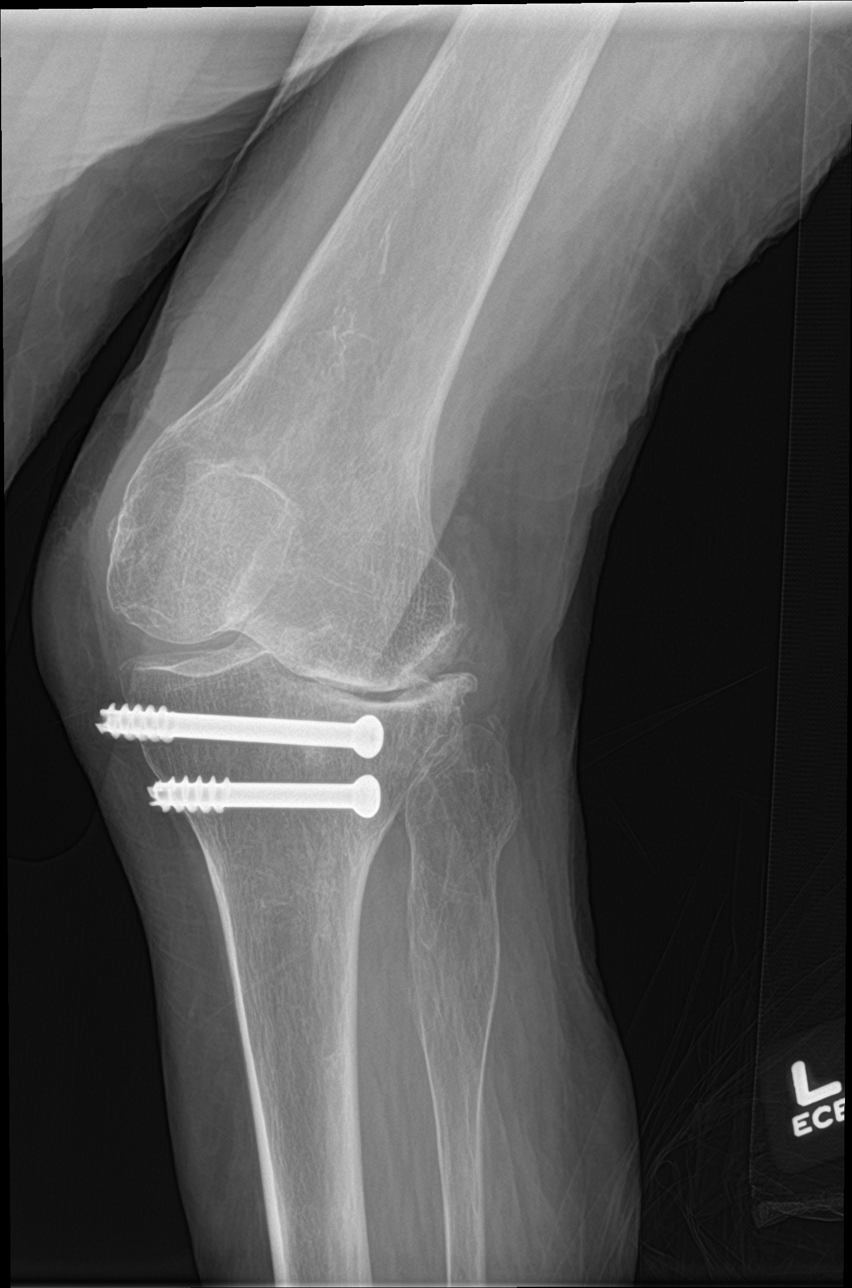

[4 of 4 positions shown; findings below may reference images not displayed]

FINDINGS: Chronic fracture left tibial plateau fixed with 2 transverse screws.
Soft tissue calcification in the medial collateral ligament. Medial
joint space normal. Marked varus angulation of the knee. Thickening
of the proximal fibula likely due to chronic fracture.

Negative for acute fracture
IMPRESSION: Chronic fractures as above. Degenerative changes most prominent in
the lateral joint compartment. No acute fracture.

## 2021-11-02 IMAGING — RF DG C-ARM 1-60 MIN
1 series · 2 of 2 positions shown · non-contrast
Comparison: 12/20/2019

CLINICAL DATA: ORIF left hip

EXAM:
DG C-ARM 1-60 MIN; OPERATIVE LEFT HIP WITH PELVIS

[Series 1: run · 2 of 2 slices shown]
[im 1/2]
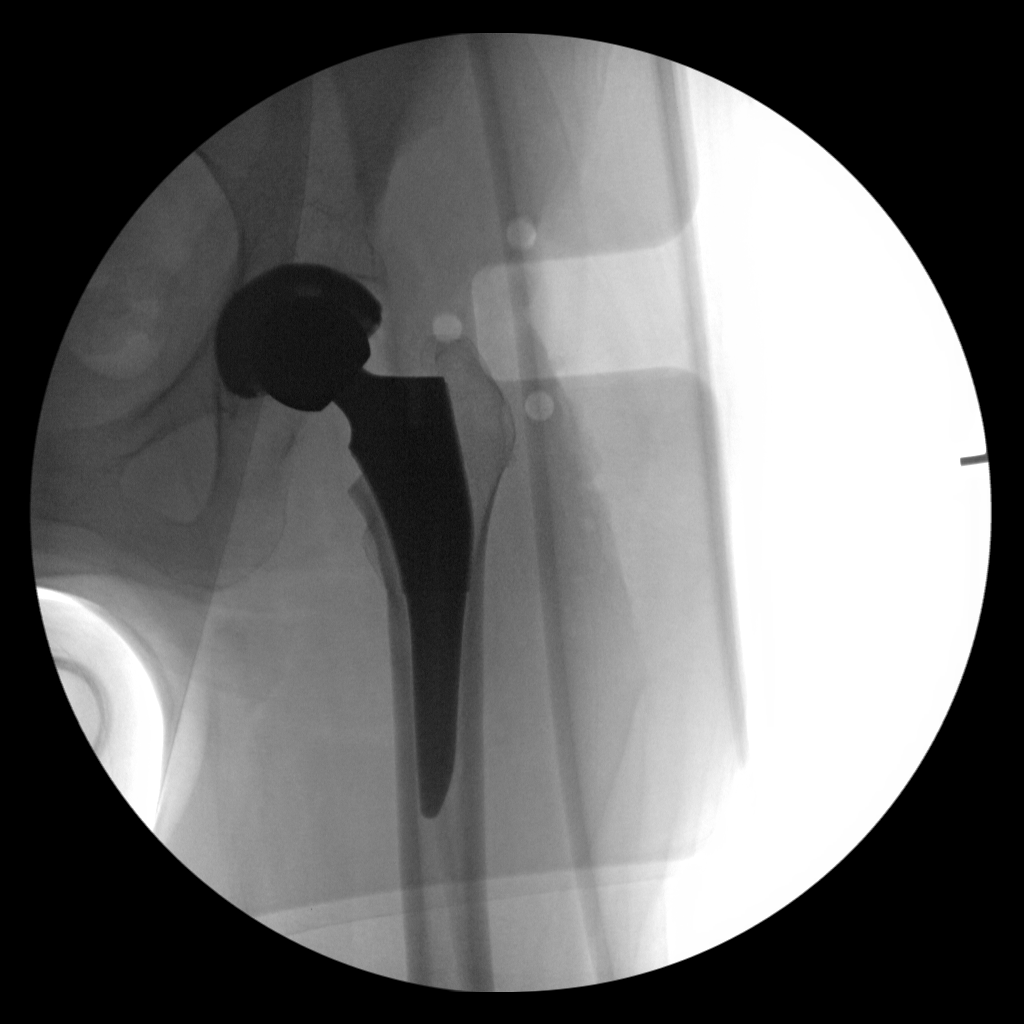
[im 2/2]
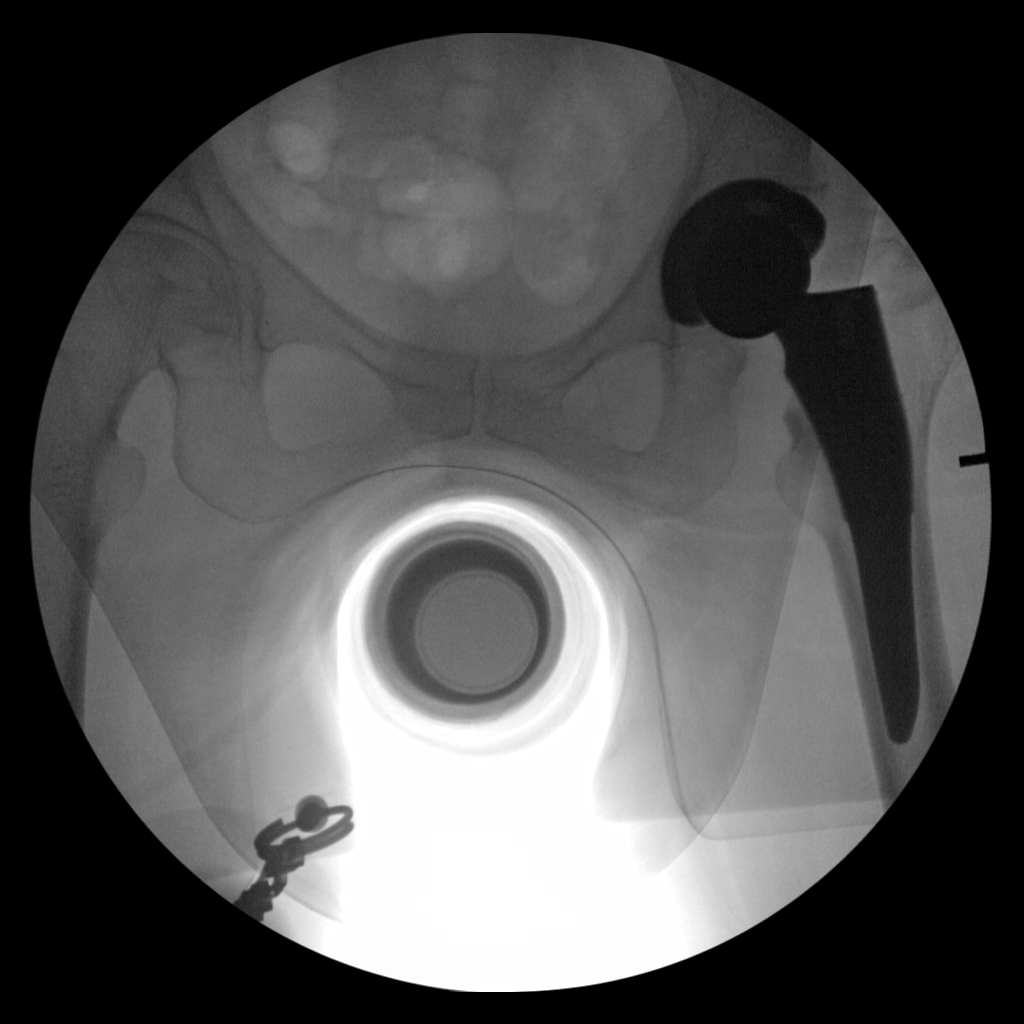

[2 of 2 positions shown; findings below may reference images not displayed]

FINDINGS: Changes of left hip replacement. Normal AP alignment. No hardware or
bony complicating feature.
IMPRESSION: Left hip replacement.  No visible complicating feature.

## 2021-11-08 ENCOUNTER — Encounter (HOSPITAL_COMMUNITY)
Admission: RE | Admit: 2021-11-08 | Discharge: 2021-11-08 | Disposition: A | Discharge status: Home / Self Care | Payer: Medicare Other | Source: Ambulatory Visit | Attending: Nephrology | Admitting: Nephrology

## 2021-11-08 ENCOUNTER — Other Ambulatory Visit: Payer: Self-pay

## 2021-11-08 DIAGNOSIS — D638 Anemia in other chronic diseases classified elsewhere: Secondary | ICD-10-CM | POA: Diagnosis not present

## 2021-11-08 LAB — POCT HEMOGLOBIN-HEMACUE: Hemoglobin: 9.6 g/dL — ABNORMAL LOW (ref 12.0–15.0)

## 2021-11-08 MED ORDER — EPOETIN ALFA-EPBX 10000 UNIT/ML IJ SOLN
INTRAMUSCULAR | Status: AC
  Administered 2021-11-08: 20000 [IU]
  Filled 2021-11-08: qty 2

## 2021-11-08 MED ORDER — EPOETIN ALFA-EPBX 10000 UNIT/ML IJ SOLN: 20000.0000 [IU] | Freq: Once | INTRAMUSCULAR | Status: DC

## 2021-11-18 DIAGNOSIS — I129 Hypertensive chronic kidney disease with stage 1 through stage 4 chronic kidney disease, or unspecified chronic kidney disease: Secondary | ICD-10-CM | POA: Diagnosis not present

## 2021-11-18 DIAGNOSIS — N184 Chronic kidney disease, stage 4 (severe): Secondary | ICD-10-CM | POA: Diagnosis not present

## 2021-11-18 DIAGNOSIS — N2581 Secondary hyperparathyroidism of renal origin: Secondary | ICD-10-CM | POA: Diagnosis not present

## 2021-11-18 DIAGNOSIS — M1990 Unspecified osteoarthritis, unspecified site: Secondary | ICD-10-CM | POA: Diagnosis not present

## 2021-11-18 DIAGNOSIS — D631 Anemia in chronic kidney disease: Secondary | ICD-10-CM | POA: Diagnosis not present

## 2021-11-18 DIAGNOSIS — N189 Chronic kidney disease, unspecified: Secondary | ICD-10-CM | POA: Diagnosis not present

## 2021-11-22 ENCOUNTER — Encounter (HOSPITAL_COMMUNITY)
Admission: RE | Admit: 2021-11-22 | Discharge: 2021-11-22 | Disposition: A | Discharge status: Home / Self Care | Payer: Medicare Other | Source: Ambulatory Visit | Attending: Nephrology | Admitting: Nephrology

## 2021-11-22 DIAGNOSIS — D638 Anemia in other chronic diseases classified elsewhere: Secondary | ICD-10-CM | POA: Diagnosis not present

## 2021-11-22 LAB — IRON AND TIBC
Iron: 48 ug/dL (ref 28–170)
Saturation Ratios: 16 % (ref 10.4–31.8)
TIBC: 305 ug/dL (ref 250–450)
UIBC: 257 ug/dL

## 2021-11-22 LAB — POCT HEMOGLOBIN-HEMACUE: Hemoglobin: 9.9 g/dL — ABNORMAL LOW (ref 12.0–15.0)

## 2021-11-22 LAB — FERRITIN: Ferritin: 1620 ng/mL — ABNORMAL HIGH (ref 11–307)

## 2021-11-22 MED ORDER — EPOETIN ALFA-EPBX 10000 UNIT/ML IJ SOLN
20000.0000 [IU] | INTRAMUSCULAR | Status: DC
  Administered 2021-11-22: 20000 [IU] via SUBCUTANEOUS

## 2021-11-22 MED ORDER — EPOETIN ALFA-EPBX 10000 UNIT/ML IJ SOLN
INTRAMUSCULAR | Status: AC
  Filled 2021-11-22: qty 2

## 2021-12-06 ENCOUNTER — Encounter (HOSPITAL_COMMUNITY)
Admission: RE | Admit: 2021-12-06 | Discharge: 2021-12-06 | Disposition: A | Discharge status: Home / Self Care | Payer: Medicare Other | Source: Ambulatory Visit | Attending: Nephrology | Admitting: Nephrology

## 2021-12-06 ENCOUNTER — Other Ambulatory Visit: Payer: Self-pay

## 2021-12-06 VITALS — BP 133/62 | HR 80 | Temp 97.4°F | Resp 18

## 2021-12-06 DIAGNOSIS — D638 Anemia in other chronic diseases classified elsewhere: Secondary | ICD-10-CM | POA: Diagnosis not present

## 2021-12-06 LAB — POCT HEMOGLOBIN-HEMACUE: Hemoglobin: 8.8 g/dL — ABNORMAL LOW (ref 12.0–15.0)

## 2021-12-06 MED ORDER — EPOETIN ALFA-EPBX 10000 UNIT/ML IJ SOLN
INTRAMUSCULAR | Status: AC
  Filled 2021-12-06: qty 2

## 2021-12-06 MED ORDER — EPOETIN ALFA-EPBX 10000 UNIT/ML IJ SOLN
20000.0000 [IU] | INTRAMUSCULAR | Status: DC
  Administered 2021-12-06: 20000 [IU] via SUBCUTANEOUS

## 2021-12-20 ENCOUNTER — Other Ambulatory Visit: Payer: Self-pay

## 2021-12-20 ENCOUNTER — Encounter (HOSPITAL_COMMUNITY)
Admission: RE | Admit: 2021-12-20 | Discharge: 2021-12-20 | Disposition: A | Discharge status: Home / Self Care | Payer: Medicare Other | Source: Ambulatory Visit | Attending: Nephrology | Admitting: Nephrology

## 2021-12-20 VITALS — BP 140/61 | HR 84 | Temp 97.4°F | Resp 18

## 2021-12-20 DIAGNOSIS — D638 Anemia in other chronic diseases classified elsewhere: Secondary | ICD-10-CM | POA: Diagnosis not present

## 2021-12-20 LAB — IRON AND TIBC
Iron: 112 ug/dL (ref 28–170)
Saturation Ratios: 29 % (ref 10.4–31.8)
TIBC: 382 ug/dL (ref 250–450)
UIBC: 270 ug/dL

## 2021-12-20 LAB — POCT HEMOGLOBIN-HEMACUE: Hemoglobin: 9.5 g/dL — ABNORMAL LOW (ref 12.0–15.0)

## 2021-12-20 LAB — FERRITIN: Ferritin: 1852 ng/mL — ABNORMAL HIGH (ref 11–307)

## 2021-12-20 MED ORDER — EPOETIN ALFA-EPBX 10000 UNIT/ML IJ SOLN
INTRAMUSCULAR | Status: AC
  Filled 2021-12-20: qty 2

## 2021-12-20 MED ORDER — EPOETIN ALFA-EPBX 10000 UNIT/ML IJ SOLN
20000.0000 [IU] | INTRAMUSCULAR | Status: DC
  Administered 2021-12-20: 20000 [IU] via SUBCUTANEOUS

## 2022-01-03 ENCOUNTER — Encounter (HOSPITAL_COMMUNITY)
Admission: RE | Admit: 2022-01-03 | Discharge: 2022-01-03 | Disposition: A | Discharge status: Home / Self Care | Payer: Medicare Other | Source: Ambulatory Visit | Attending: Nephrology | Admitting: Nephrology

## 2022-01-03 ENCOUNTER — Other Ambulatory Visit: Payer: Self-pay

## 2022-01-03 VITALS — BP 126/80 | HR 93 | Temp 97.3°F | Resp 18

## 2022-01-03 DIAGNOSIS — D638 Anemia in other chronic diseases classified elsewhere: Secondary | ICD-10-CM

## 2022-01-03 LAB — POCT HEMOGLOBIN-HEMACUE: Hemoglobin: 9.3 g/dL — ABNORMAL LOW (ref 12.0–15.0)

## 2022-01-03 MED ORDER — EPOETIN ALFA-EPBX 10000 UNIT/ML IJ SOLN
INTRAMUSCULAR | Status: AC
  Filled 2022-01-03: qty 2

## 2022-01-03 MED ORDER — EPOETIN ALFA-EPBX 10000 UNIT/ML IJ SOLN
20000.0000 [IU] | INTRAMUSCULAR | Status: DC
  Administered 2022-01-03: 20000 [IU] via SUBCUTANEOUS

## 2022-01-17 ENCOUNTER — Encounter (HOSPITAL_COMMUNITY)
Admission: RE | Admit: 2022-01-17 | Discharge: 2022-01-17 | Disposition: A | Discharge status: Home / Self Care | Payer: Medicare Other | Source: Ambulatory Visit | Attending: Nephrology | Admitting: Nephrology

## 2022-01-17 VITALS — BP 151/62 | HR 92 | Temp 97.5°F | Resp 18

## 2022-01-17 DIAGNOSIS — D638 Anemia in other chronic diseases classified elsewhere: Secondary | ICD-10-CM

## 2022-01-17 LAB — POCT HEMOGLOBIN-HEMACUE: Hemoglobin: 9.1 g/dL — ABNORMAL LOW (ref 12.0–15.0)

## 2022-01-17 MED ORDER — EPOETIN ALFA-EPBX 10000 UNIT/ML IJ SOLN
INTRAMUSCULAR | Status: AC
  Administered 2022-01-17: 20000 [IU] via SUBCUTANEOUS
  Filled 2022-01-17: qty 2

## 2022-01-17 MED ORDER — EPOETIN ALFA-EPBX 10000 UNIT/ML IJ SOLN: 20000.0000 [IU] | INTRAMUSCULAR | Status: DC

## 2022-01-24 DIAGNOSIS — M436 Torticollis: Secondary | ICD-10-CM | POA: Diagnosis not present

## 2022-01-27 DIAGNOSIS — R404 Transient alteration of awareness: Secondary | ICD-10-CM | POA: Diagnosis not present

## 2022-01-27 DIAGNOSIS — R4020 Unspecified coma: Secondary | ICD-10-CM | POA: Diagnosis not present

## 2022-01-31 ENCOUNTER — Encounter (HOSPITAL_COMMUNITY): Payer: Medicare Other

## 2022-02-14 ENCOUNTER — Encounter (HOSPITAL_COMMUNITY): Payer: Medicare Other

## 2022-02-17 DIAGNOSIS — 419620001 Death: Secondary | SNOMED CT | POA: Diagnosis not present

## 2022-02-17 DEATH — deceased
